# Patient Record
Sex: Female | Born: 1975
Health system: Southern US, Community
[De-identification: ages and names within clinical notes are randomized; demographics above are authoritative.]

## PROBLEM LIST (undated history)

## (undated) DIAGNOSIS — L732 Hidradenitis suppurativa: Secondary | ICD-10-CM

## (undated) HISTORY — DX: Hidradenitis suppurativa: L73.2

## (undated) HISTORY — PX: ABDOMINAL HYSTERECTOMY: SHX81

---

## 1999-12-06 ENCOUNTER — Emergency Department (HOSPITAL_COMMUNITY): Admission: EM | Admit: 1999-12-06 | Discharge: 1999-12-06 | Payer: Self-pay | Admitting: Emergency Medicine

## 2000-05-15 ENCOUNTER — Other Ambulatory Visit: Admission: RE | Admit: 2000-05-15 | Discharge: 2000-05-15 | Payer: Self-pay | Admitting: Obstetrics and Gynecology

## 2000-05-29 ENCOUNTER — Ambulatory Visit (HOSPITAL_COMMUNITY): Admission: RE | Admit: 2000-05-29 | Discharge: 2000-05-29 | Payer: Self-pay | Admitting: Obstetrics and Gynecology

## 2000-05-29 ENCOUNTER — Encounter: Payer: Self-pay | Admitting: Obstetrics and Gynecology

## 2000-09-21 ENCOUNTER — Inpatient Hospital Stay (HOSPITAL_COMMUNITY): Admission: AD | Admit: 2000-09-21 | Discharge: 2000-09-21 | Payer: Self-pay | Admitting: Obstetrics and Gynecology

## 2000-09-29 ENCOUNTER — Inpatient Hospital Stay (HOSPITAL_COMMUNITY): Admission: AD | Admit: 2000-09-29 | Discharge: 2000-10-01 | Payer: Self-pay | Admitting: Obstetrics & Gynecology

## 2003-11-17 ENCOUNTER — Other Ambulatory Visit: Admission: RE | Admit: 2003-11-17 | Discharge: 2003-11-17 | Payer: Self-pay | Admitting: Obstetrics and Gynecology

## 2003-12-21 ENCOUNTER — Emergency Department (HOSPITAL_COMMUNITY): Admission: EM | Admit: 2003-12-21 | Discharge: 2003-12-21 | Payer: Self-pay | Admitting: Emergency Medicine

## 2004-05-21 ENCOUNTER — Emergency Department (HOSPITAL_COMMUNITY): Admission: EM | Admit: 2004-05-21 | Discharge: 2004-05-21 | Payer: Self-pay | Admitting: Emergency Medicine

## 2004-07-02 ENCOUNTER — Emergency Department (HOSPITAL_COMMUNITY): Admission: EM | Admit: 2004-07-02 | Discharge: 2004-07-02 | Payer: Self-pay | Admitting: *Deleted

## 2004-07-05 ENCOUNTER — Inpatient Hospital Stay (HOSPITAL_COMMUNITY): Admission: AD | Admit: 2004-07-05 | Discharge: 2004-07-05 | Payer: Self-pay | Admitting: *Deleted

## 2004-07-06 ENCOUNTER — Inpatient Hospital Stay (HOSPITAL_COMMUNITY): Admission: AD | Admit: 2004-07-06 | Discharge: 2004-07-06 | Payer: Self-pay | Admitting: *Deleted

## 2004-07-13 ENCOUNTER — Inpatient Hospital Stay (HOSPITAL_COMMUNITY): Admission: AD | Admit: 2004-07-13 | Discharge: 2004-07-13 | Payer: Self-pay | Admitting: *Deleted

## 2004-12-16 ENCOUNTER — Other Ambulatory Visit: Admission: RE | Admit: 2004-12-16 | Discharge: 2004-12-16 | Payer: Self-pay | Admitting: Obstetrics and Gynecology

## 2005-07-21 IMAGING — CR DG ANKLE COMPLETE 3+V*R*
3 series · 3 of 3 positions shown · non-contrast
Comparison: none

CLINICAL DATA: Fall with right foot and ankle pain. Injury and swelling.
 RIGHT CDDZ-X VIEW:
 There is no evidence of fracture or dislocation.  There is no evidence of arthropathy or other focal bone abnormality.  Soft tissues are unremarkable.

[t ankle joint ap right]
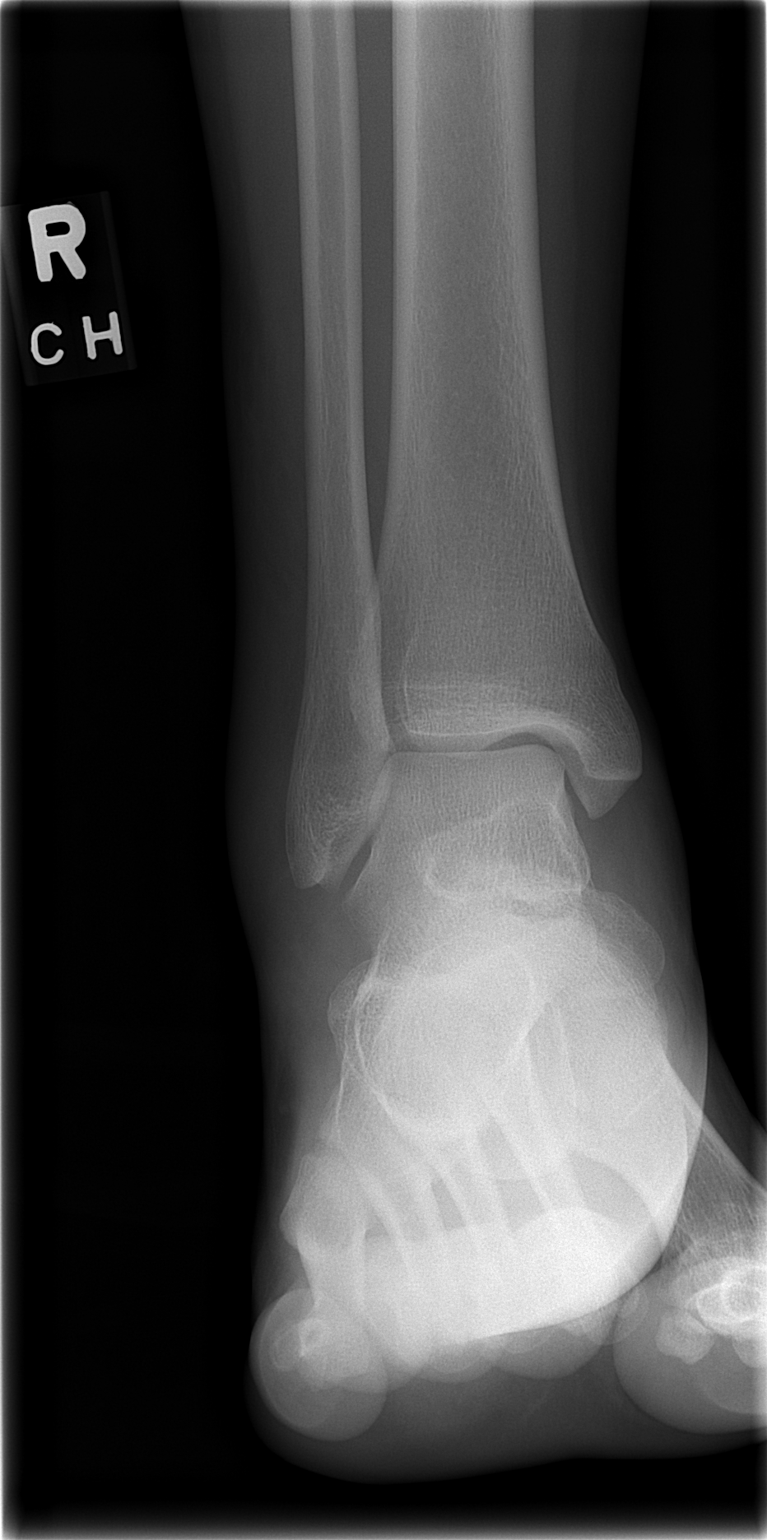

[t ankle joint oblique right]
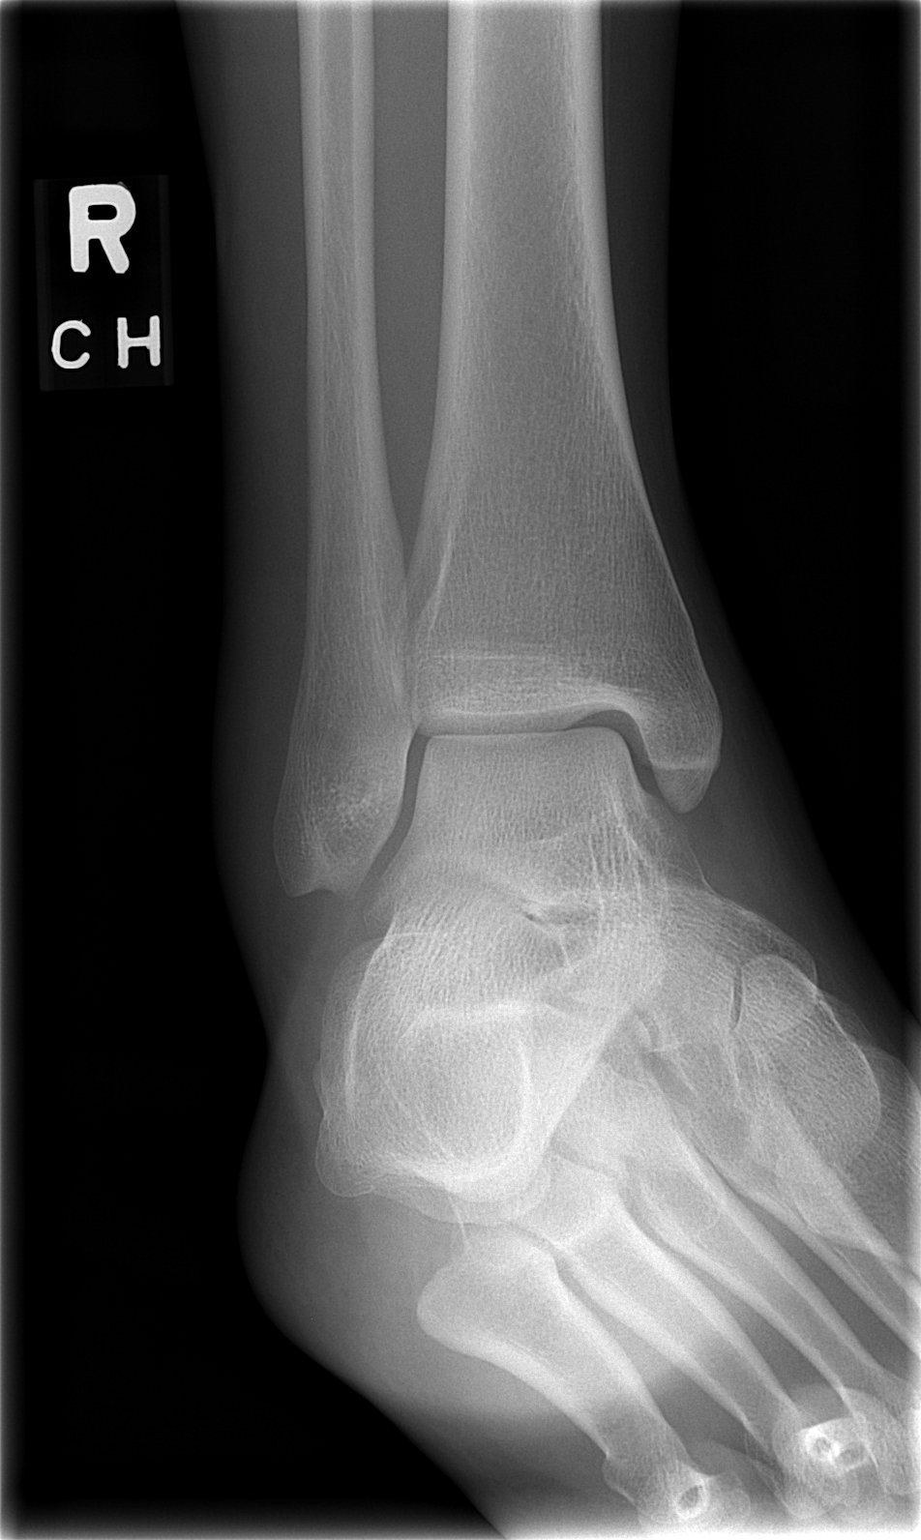

[t ankle joint lat right]
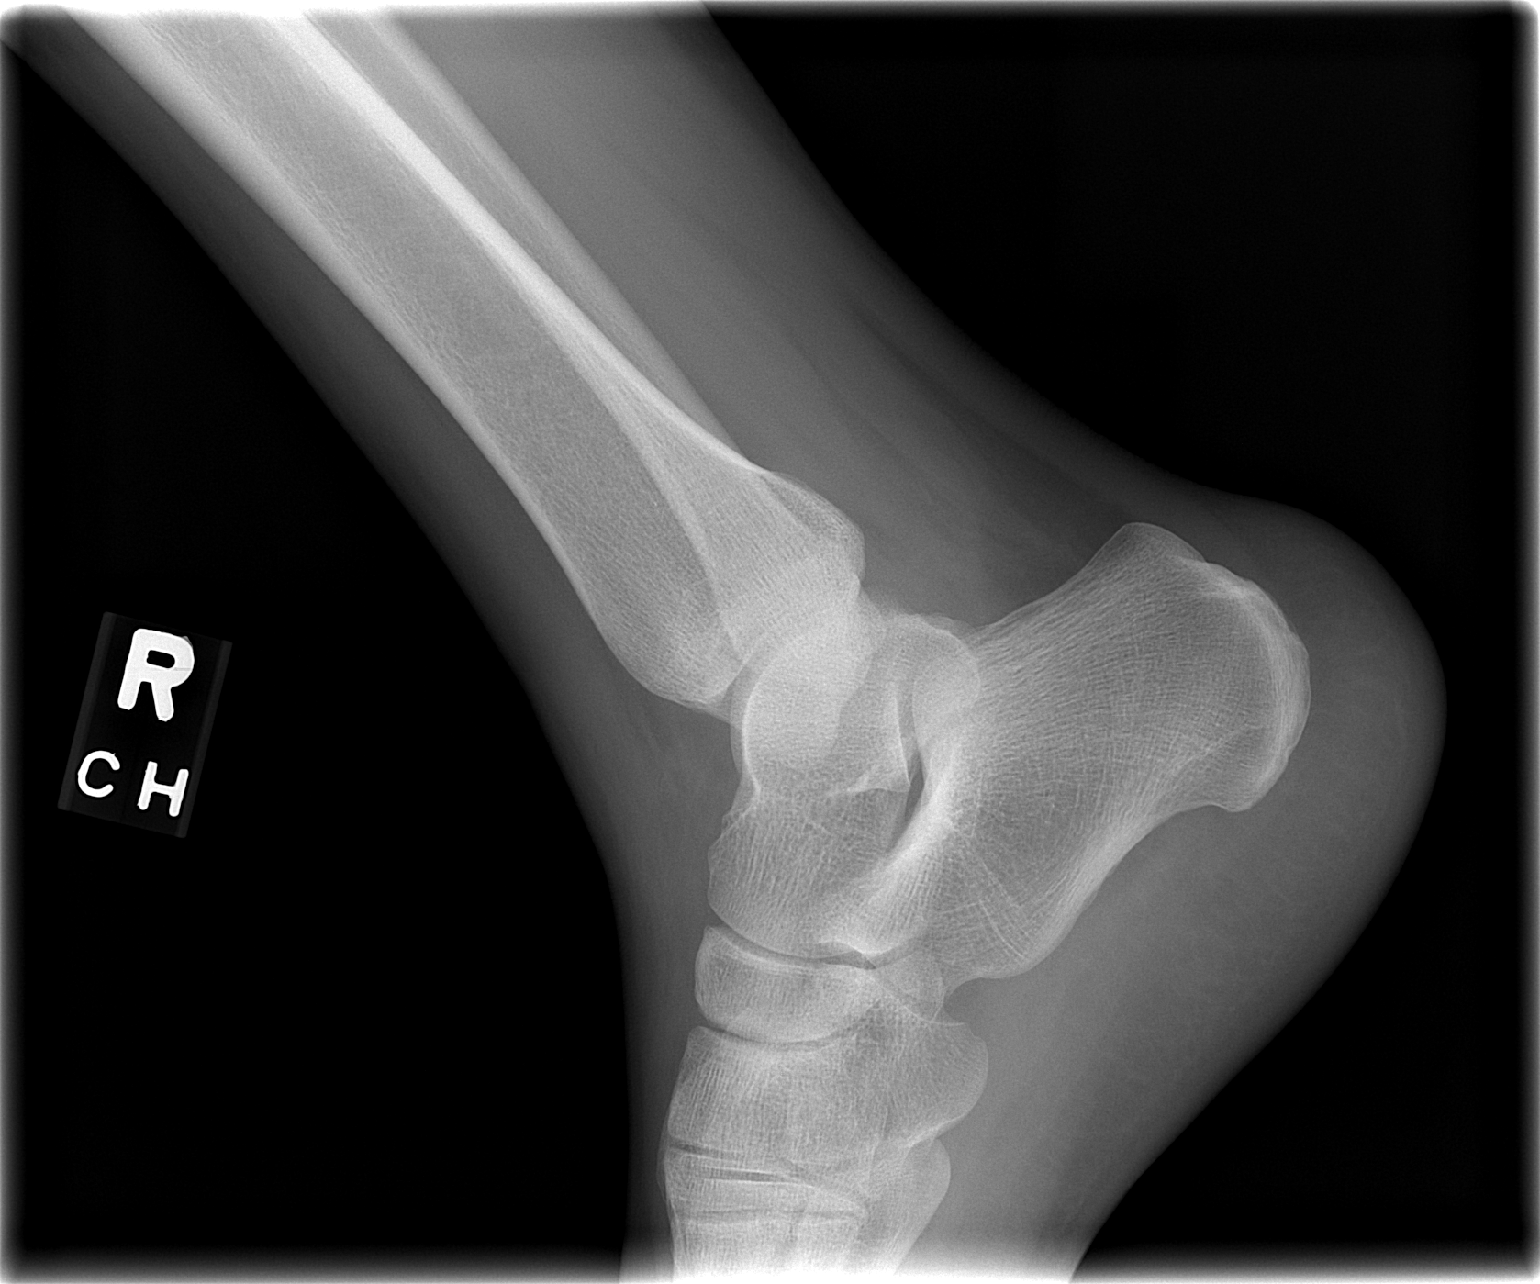

[3 of 3 positions shown; findings below may reference images not displayed]

IMPRESSION: Negative.
 RIGHT [id number] VIEW:
 Lateral soft tissue swelling without evidence of fracture, subluxation or dislocation identified.  No other significant abnormality is identified. Ankle mortise is intact.
IMPRESSION: Soft tissue swelling without evidence of fracture.

## 2005-08-18 ENCOUNTER — Ambulatory Visit (HOSPITAL_COMMUNITY): Admission: RE | Admit: 2005-08-18 | Discharge: 2005-08-19 | Payer: Self-pay | Admitting: Obstetrics and Gynecology

## 2010-02-07 ENCOUNTER — Encounter: Payer: Self-pay | Admitting: Obstetrics and Gynecology

## 2010-03-20 ENCOUNTER — Inpatient Hospital Stay (HOSPITAL_COMMUNITY)
Admission: AD | Admit: 2010-03-20 | Discharge: 2010-03-20 | Disposition: A | Discharge status: Home / Self Care | Payer: Managed Care, Other (non HMO) | Source: Ambulatory Visit | Attending: Family Medicine | Admitting: Family Medicine

## 2010-03-20 DIAGNOSIS — A499 Bacterial infection, unspecified: Secondary | ICD-10-CM

## 2010-03-20 DIAGNOSIS — N760 Acute vaginitis: Secondary | ICD-10-CM

## 2010-03-20 DIAGNOSIS — B9689 Other specified bacterial agents as the cause of diseases classified elsewhere: Secondary | ICD-10-CM | POA: Insufficient documentation

## 2010-03-20 DIAGNOSIS — N390 Urinary tract infection, site not specified: Secondary | ICD-10-CM

## 2010-03-20 LAB — URINALYSIS, ROUTINE W REFLEX MICROSCOPIC
Bilirubin Urine: NEGATIVE
Ketones, ur: NEGATIVE mg/dL
Leukocytes, UA: NEGATIVE
Protein, ur: NEGATIVE mg/dL

## 2010-03-20 LAB — WET PREP, GENITAL: Trich, Wet Prep: NONE SEEN

## 2010-03-20 LAB — URINE MICROSCOPIC-ADD ON

## 2010-05-17 ENCOUNTER — Emergency Department (HOSPITAL_COMMUNITY)
Admission: EM | Admit: 2010-05-17 | Discharge: 2010-05-17 | Disposition: A | Discharge status: Home / Self Care | Payer: No Typology Code available for payment source | Attending: Emergency Medicine | Admitting: Emergency Medicine

## 2010-05-17 ENCOUNTER — Emergency Department (HOSPITAL_COMMUNITY): Payer: No Typology Code available for payment source

## 2010-05-17 DIAGNOSIS — S93409A Sprain of unspecified ligament of unspecified ankle, initial encounter: Secondary | ICD-10-CM | POA: Insufficient documentation

## 2010-05-17 DIAGNOSIS — M25579 Pain in unspecified ankle and joints of unspecified foot: Secondary | ICD-10-CM | POA: Insufficient documentation

## 2010-05-17 DIAGNOSIS — M25476 Effusion, unspecified foot: Secondary | ICD-10-CM | POA: Insufficient documentation

## 2010-05-17 DIAGNOSIS — M25473 Effusion, unspecified ankle: Secondary | ICD-10-CM | POA: Insufficient documentation

## 2012-10-07 ENCOUNTER — Ambulatory Visit (INDEPENDENT_AMBULATORY_CARE_PROVIDER_SITE_OTHER): Payer: Managed Care, Other (non HMO) | Admitting: Family Medicine

## 2012-10-07 VITALS — BP 128/86 | HR 90 | Temp 98.7°F | Resp 18 | Ht 68.5 in | Wt 170.0 lb

## 2012-10-07 DIAGNOSIS — L02419 Cutaneous abscess of limb, unspecified: Secondary | ICD-10-CM

## 2012-10-07 DIAGNOSIS — L732 Hidradenitis suppurativa: Secondary | ICD-10-CM

## 2012-10-07 DIAGNOSIS — IMO0002 Reserved for concepts with insufficient information to code with codable children: Secondary | ICD-10-CM

## 2012-10-07 MED ORDER — TRAMADOL HCL 50 MG PO TABS: 50.0000 mg | ORAL_TABLET | Freq: Three times a day (TID) | ORAL | Status: DC | PRN

## 2012-10-07 MED ORDER — MUPIROCIN 2 % EX OINT: TOPICAL_OINTMENT | Freq: Three times a day (TID) | CUTANEOUS | Status: DC

## 2012-10-07 MED ORDER — DOXYCYCLINE HYCLATE 100 MG PO CAPS: 100.0000 mg | ORAL_CAPSULE | Freq: Two times a day (BID) | ORAL | Status: DC

## 2012-10-07 NOTE — Progress Notes (Signed)
   Patient ID: ZAYANNA PUNDT MRN: [id number], DOB: April 27, 1975, 37 y.o. Date of Encounter: 10/07/2012, 1:38 PM    PROCEDURE NOTE: Verbal consent obtained. Risks and benefits of the procedure were explained to the patient. Patient made an informed decision to proceed with the procedure. Betadine prep per usual protocol. Local anesthesia obtained with 1% lidocaine with epi 2 cc.   Lesion is linear and superficial.  2 cm incision made with 11 blade along lesion.  Culture previously taken. Small amount of purulence expressed. Lesion explored revealing no loculations. Irrigated with normal saline. Unable to pack secondary to the superficial nature of the lesion. Advised patient to perform salt water flushes.  Dressed. Wound care instructions including precautions with patient. Patient tolerated the procedure well. Recheck in 48 hours.      Signed, Eula Listen, PA-C 10/07/2012 1:38 PM

## 2012-10-07 NOTE — Patient Instructions (Signed)
Abscess °Care After °An abscess (also called a boil or furuncle) is an infected area that contains a collection of pus. Signs and symptoms of an abscess include pain, tenderness, redness, or hardness, or you may feel a moveable soft area under your skin. An abscess can occur anywhere in the body. The infection may spread to surrounding tissues causing cellulitis. A cut (incision) by the surgeon was made over your abscess and the pus was drained out. Gauze may have been packed into the space to provide a drain that will allow the cavity to heal from the inside outwards. The boil may be painful for 5 to 7 days. Most people with a boil do not have high fevers. Your abscess, if seen early, may not have localized, and may not have been lanced. If not, another appointment may be required for this if it does not get better on its own or with medications. °HOME CARE INSTRUCTIONS  °· Only take over-the-counter or prescription medicines for pain, discomfort, or fever as directed by your caregiver. °· When you bathe, soak and then remove gauze or iodoform packs at least daily or as directed by your caregiver. You may then wash the wound gently with mild soapy water. Repack with gauze or do as your caregiver directs. °SEEK IMMEDIATE MEDICAL CARE IF:  °· You develop increased pain, swelling, redness, drainage, or bleeding in the wound site. °· You develop signs of generalized infection including muscle aches, chills, fever, or a general ill feeling. °· An oral temperature above 102° F (38.9° C) develops, not controlled by medication. °See your caregiver for a recheck if you develop any of the symptoms described above. If medications (antibiotics) were prescribed, take them as directed. °Document Released: 07/22/2004 Document Revised: 03/28/2011 Document Reviewed: 03/19/2007 °ExitCare® Patient Information ©2014 [employer]. ° °Hidradenitis Suppurativa, Sweat Gland Abscess °Hidradenitis suppurativa is a long lasting (chronic),  uncommon disease of the sweat glands. With this, boil-like lumps and scarring develop in the groin, some times under the arms (axillae), and under the breasts. It may also uncommonly occur behind the ears, in the crease of the buttocks, and around the genitals.  °CAUSES  °The cause is from a blocking of the sweat glands. They then become infected. It may cause drainage and odor. It is not contagious. So it cannot be given to someone else. It most often shows up in puberty (about 10 to 38 years of age). But it may happen much later. It is similar to acne which is a disease of the sweat glands. This condition is slightly more common in African-Americans and women. °SYMPTOMS  °· Hidradenitis usually starts as one or more red, tender, swellings in the groin or under the arms (axilla). °· Over a period of hours to days the lesions get larger. They often open to the skin surface, draining clear to yellow-colored fluid. °· The infected area heals with scarring. °DIAGNOSIS  °Your caregiver makes this diagnosis by looking at you. Sometimes cultures (growing germs on plates in the lab) may be taken. This is to see what germ (bacterium) is causing the infection.  °TREATMENT  °· Topical germ killing medicine applied to the skin (antibiotics) are the treatment of choice. Antibiotics taken by mouth (systemic) are sometimes needed when the condition is getting worse or is severe. °· Avoid tight-fitting clothing which traps moisture in. °· Dirt does not cause hidradenitis and it is not caused by poor hygiene. °· Involved areas should be cleaned daily using an antibacterial soap. Some   patients find that the liquid form of Lever 2000®, applied to the involved areas as a lotion after bathing, can help reduce the odor related to this condition. °· Sometimes surgery is needed to drain infected areas or remove scarred tissue. Removal of large amounts of tissue is used only in severe cases. °· Birth control pills may be helpful. °· Oral  retinoids (vitamin A derivatives) for 6 to 12 months which are effective for acne may also help this condition. °· Weight loss will improve but not cure hidradenitis. It is made worse by being overweight. But the condition is not caused by being overweight. °· This condition is more common in people who have had acne. °· It may become worse under stress. °There is no medical cure for hidradenitis. It can be controlled, but not cured. The condition usually continues for years with periods of getting worse and getting better (remission). °Document Released: 08/18/2003 Document Revised: 03/28/2011 Document Reviewed: 09/03/2007 °ExitCare® Patient Information ©2014 [employer]. ° ° °

## 2012-10-07 NOTE — Progress Notes (Signed)
  Subjective:    Patient ID: Chloe Ortega, female    DOB: 04/28/1975, 37 y.o.   MRN: [id number] Chief Complaint  Patient presents with  . Recurrent Skin Infections    right axilla   HPI  Has a recurrent abscess in her left axilla that flairs up recurrently for many years.  Will be intermittent over the years but for the past several monthsthis one has fluctuated and has never fully resolved as keeps flairing up with sweats, using deoderant, using any product other than ivory soap.  Has been doing topical heat, warm compresses w/o resolution  History reviewed. No pertinent past medical history. No current outpatient prescriptions on file prior to visit.   No current facility-administered medications on file prior to visit.   No Known Allergies   Review of Systems  Constitutional: Negative for fever, chills, diaphoresis and activity change.  Musculoskeletal: Positive for myalgias. Negative for gait problem and joint swelling.  Skin: Positive for color change and rash. Negative for pallor and wound.  Hematological: Negative for adenopathy. Does not bruise/bleed easily.      BP 128/86  Pulse 90  Temp(Src) 98.7 F (37.1 C) (Oral)  Resp 18  Ht 5' 8.5" (1.74 m)  Wt 170 lb (77.111 kg)  BMI 25.47 kg/m2  SpO2 100% Objective:   Physical Exam  Constitutional: She is oriented to person, place, and time. She appears well-developed and well-nourished. No distress.  HENT:  Head: Normocephalic and atraumatic.  Eyes: Conjunctivae are normal. No scleral icterus.  Pulmonary/Chest: Effort normal.  Musculoskeletal: She exhibits no edema.  Neurological: She is alert and oriented to person, place, and time.  Skin: Skin is warm and dry. Lesion noted. She is not diaphoretic. There is erythema.  Left axilla w/ mult scars, sev cm nodule very tender, fluctuant, erythema, no warmth.  Psychiatric: She has a normal mood and affect. Her behavior is normal.          Assessment & Plan:   Axillary  abscess - Plan: Wound culture, Ambulatory referral to Dermatology - since recurrent will refer to derm. S/p I&D today.  Hydradenitis  Meds ordered this encounter  Medications  . doxycycline (VIBRAMYCIN) 100 MG capsule    Sig: Take 1 capsule (100 mg total) by mouth 2 (two) times daily.    Dispense:  28 capsule    Refill:  0  . mupirocin ointment (BACTROBAN) 2 %    Sig: Apply topically 3 (three) times daily.    Dispense:  30 g    Refill:  2  . traMADol (ULTRAM) 50 MG tablet    Sig: Take 1 tablet (50 mg total) by mouth every 8 (eight) hours as needed for pain.    Dispense:  20 tablet    Refill:  0    Norberto Sorenson, MD MPH

## 2012-10-09 LAB — WOUND CULTURE

## 2012-12-22 ENCOUNTER — Ambulatory Visit (INDEPENDENT_AMBULATORY_CARE_PROVIDER_SITE_OTHER): Payer: Managed Care, Other (non HMO) | Admitting: Physician Assistant

## 2012-12-22 VITALS — BP 122/74 | HR 80 | Temp 98.1°F | Resp 16 | Ht 68.0 in | Wt 169.8 lb

## 2012-12-22 DIAGNOSIS — R350 Frequency of micturition: Secondary | ICD-10-CM

## 2012-12-22 DIAGNOSIS — L732 Hidradenitis suppurativa: Secondary | ICD-10-CM

## 2012-12-22 LAB — POCT URINALYSIS DIPSTICK
Glucose, UA: NEGATIVE
Ketones, UA: NEGATIVE
Protein, UA: NEGATIVE
Spec Grav, UA: 1.025
Urobilinogen, UA: 0.2
pH, UA: 6.5

## 2012-12-22 LAB — POCT UA - MICROSCOPIC ONLY
Casts, Ur, LPF, POC: NEGATIVE
Mucus, UA: POSITIVE
Yeast, UA: NEGATIVE

## 2012-12-22 MED ORDER — TRAMADOL HCL 50 MG PO TABS: 50.0000 mg | ORAL_TABLET | Freq: Three times a day (TID) | ORAL | Status: DC | PRN

## 2012-12-22 MED ORDER — CIPROFLOXACIN HCL 500 MG PO TABS: 500.0000 mg | ORAL_TABLET | Freq: Two times a day (BID) | ORAL | Status: DC

## 2012-12-22 NOTE — Patient Instructions (Addendum)
Get plenty of rest and drink at least 64 ounces of water daily.  Apply warm compresses on the area in the underarm can help it resolve.  I will contact you with your lab results (urine culture) as soon as they are available.   If you have not heard from me in 2 weeks, please contact me.  The fastest way to get your results is to register for My Chart (see the instructions on the last page of this printout).

## 2012-12-22 NOTE — Progress Notes (Signed)
Subjective:    Patient ID: Chloe Ortega, female    DOB: February 04, 1975, 37 y.o.   MRN: [id number]  PCP: Janell Quiet, FNP (hasn't actually seen Ms. Orvan Falconer yet, is scheduled in January 2015)  Chief Complaint  Patient presents with  . Urinary Tract Infection    Frequent urination w/odor x 2-3 wks  . Medication Refill    Tramadol   Medications, allergies, past medical history, surgical history, family history, social history and problem list reviewed and updated.  HPI  Urinary frequency, urgency and burning. No fever/chills.  Mild back pain.  No abdominal pain. Not currently sexually active. No vaginal symptoms. At work is limited from using the restroom as frequently as she needs.  She uses Tramadol PRN when she develops an axillary hidradenitis lesion.  She presently has one in the RIGHT, which she is self treating at home.  She believes this one is about to drain and resolve, and doesn't want it I&D'd here today.  Review of Systems As above. No nausea/vomiting. No myalgias.    Objective:   Physical Exam  Constitutional: She is oriented to person, place, and time. Vital signs are normal. She appears well-developed and well-nourished. No distress.  HENT:  Head: Normocephalic and atraumatic.  Cardiovascular: Normal rate, regular rhythm and normal heart sounds.   Pulmonary/Chest: Effort normal and breath sounds normal.    Abdominal: Soft. Normal appearance and bowel sounds are normal. She exhibits no distension and no mass. There is no hepatosplenomegaly. There is tenderness in the suprapubic area. There is no rigidity, no rebound, no guarding, no CVA tenderness, no tenderness at McBurney's point and negative Murphy's sign. No hernia.  Musculoskeletal: Normal range of motion.       Lumbar back: Normal.  Neurological: She is alert and oriented to person, place, and time.  Skin: Skin is warm and dry. No rash noted. She is not diaphoretic. No pallor.  Psychiatric: She  has a normal mood and affect. Her speech is normal and behavior is normal. Judgment normal.    Results for orders placed in visit on 12/22/12  POCT UA - MICROSCOPIC ONLY      Result Value Range   WBC, Ur, HPF, POC tntc     RBC, urine, microscopic tntc     Bacteria, U Microscopic 2+     Mucus, UA pos     Epithelial cells, urine per micros 2-3     Crystals, Ur, HPF, POC neg     Casts, Ur, LPF, POC neg     Yeast, UA neg    POCT URINALYSIS DIPSTICK      Result Value Range   Color, UA yellow     Clarity, UA clear     Glucose, UA neg     Bilirubin, UA neg     Ketones, UA neg     Spec Grav, UA 1.025     Blood, UA mod     pH, UA 6.5     Protein, UA neg     Urobilinogen, UA 0.2     Nitrite, UA neg     Leukocytes, UA Negative           Assessment & Plan:  1. Frequent urination Treat empirically.  Await UC.  Anticipatory guidance. - POCT UA - Microscopic Only - POCT urinalysis dipstick - Urine culture - ciprofloxacin (CIPRO) 500 MG tablet; Take 1 tablet (500 mg total) by mouth 2 (two) times daily.  Dispense: 10 tablet; Refill: 0  2. Hidradenitis axillaris Continue with home treatment, but if symptoms worsen or persist, she'll return for I&D. - traMADol (ULTRAM) 50 MG tablet; Take 1 tablet (50 mg total) by mouth every 8 (eight) hours as needed.  Dispense: 20 tablet; Refill: 0   Fernande Bras, PA-C Physician Assistant-Certified [facility]

## 2012-12-24 LAB — URINE CULTURE: Colony Count: 85000

## 2012-12-28 MED ORDER — NITROFURANTOIN MONOHYD MACRO 100 MG PO CAPS: 100.0000 mg | ORAL_CAPSULE | Freq: Two times a day (BID) | ORAL | Status: DC

## 2012-12-28 NOTE — Addendum Note (Signed)
Addended by: Cydney Ok on: 12/28/2012 09:24 AM   Modules accepted: Orders

## 2013-01-04 ENCOUNTER — Encounter: Payer: Self-pay | Admitting: Family Medicine

## 2013-01-04 DIAGNOSIS — L732 Hidradenitis suppurativa: Secondary | ICD-10-CM | POA: Insufficient documentation

## 2013-01-21 ENCOUNTER — Ambulatory Visit: Payer: Managed Care, Other (non HMO) | Admitting: Family

## 2013-07-16 ENCOUNTER — Encounter (HOSPITAL_COMMUNITY): Payer: Self-pay | Admitting: Emergency Medicine

## 2013-07-16 ENCOUNTER — Emergency Department (HOSPITAL_COMMUNITY)
Admission: EM | Admit: 2013-07-16 | Discharge: 2013-07-16 | Disposition: A | Discharge status: Home / Self Care | Payer: Managed Care, Other (non HMO) | Attending: Emergency Medicine | Admitting: Emergency Medicine

## 2013-07-16 DIAGNOSIS — M549 Dorsalgia, unspecified: Secondary | ICD-10-CM | POA: Insufficient documentation

## 2013-07-16 DIAGNOSIS — R509 Fever, unspecified: Secondary | ICD-10-CM | POA: Insufficient documentation

## 2013-07-16 DIAGNOSIS — R109 Unspecified abdominal pain: Secondary | ICD-10-CM | POA: Insufficient documentation

## 2013-07-16 DIAGNOSIS — Z872 Personal history of diseases of the skin and subcutaneous tissue: Secondary | ICD-10-CM | POA: Insufficient documentation

## 2013-07-16 DIAGNOSIS — Z3202 Encounter for pregnancy test, result negative: Secondary | ICD-10-CM | POA: Insufficient documentation

## 2013-07-16 DIAGNOSIS — F17200 Nicotine dependence, unspecified, uncomplicated: Secondary | ICD-10-CM | POA: Insufficient documentation

## 2013-07-16 DIAGNOSIS — R319 Hematuria, unspecified: Secondary | ICD-10-CM

## 2013-07-16 DIAGNOSIS — Z90710 Acquired absence of both cervix and uterus: Secondary | ICD-10-CM | POA: Insufficient documentation

## 2013-07-16 DIAGNOSIS — Z792 Long term (current) use of antibiotics: Secondary | ICD-10-CM | POA: Insufficient documentation

## 2013-07-16 LAB — CBC WITH DIFFERENTIAL/PLATELET
Basophils Absolute: 0 10*3/uL (ref 0.0–0.1)
Basophils Relative: 0 % (ref 0–1)
EOS ABS: 0 10*3/uL (ref 0.0–0.7)
EOS PCT: 0 % (ref 0–5)
HEMATOCRIT: 44.4 % (ref 36.0–46.0)
HEMOGLOBIN: 14.7 g/dL (ref 12.0–15.0)
LYMPHS ABS: 0.9 10*3/uL (ref 0.7–4.0)
Lymphocytes Relative: 6 % — ABNORMAL LOW (ref 12–46)
MCH: 29.2 pg (ref 26.0–34.0)
MCHC: 33.1 g/dL (ref 30.0–36.0)
MCV: 88.1 fL (ref 78.0–100.0)
MONO ABS: 0.7 10*3/uL (ref 0.1–1.0)
Monocytes Relative: 5 % (ref 3–12)
NEUTROS ABS: 12.5 10*3/uL — AB (ref 1.7–7.7)
NEUTROS PCT: 89 % — AB (ref 43–77)
PLATELETS: 248 10*3/uL (ref 150–400)
RBC: 5.04 MIL/uL (ref 3.87–5.11)
RDW: 15 % (ref 11.5–15.5)
WBC: 14 10*3/uL — AB (ref 4.0–10.5)

## 2013-07-16 LAB — COMPREHENSIVE METABOLIC PANEL
ALBUMIN: 4.4 g/dL (ref 3.5–5.2)
ALK PHOS: 109 U/L (ref 39–117)
ALT: 26 U/L (ref 0–35)
AST: 26 U/L (ref 0–37)
BUN: 15 mg/dL (ref 6–23)
CO2: 18 mEq/L — ABNORMAL LOW (ref 19–32)
CREATININE: 0.95 mg/dL (ref 0.50–1.10)
Calcium: 10.7 mg/dL — ABNORMAL HIGH (ref 8.4–10.5)
Chloride: 102 mEq/L (ref 96–112)
GFR, EST AFRICAN AMERICAN: 86 mL/min — AB (ref >90)
GFR, EST NON AFRICAN AMERICAN: 74 mL/min — AB (ref >90)
Glucose, Bld: 140 mg/dL — ABNORMAL HIGH (ref 70–99)
Potassium: 5.2 mEq/L (ref 3.7–5.3)
SODIUM: 141 meq/L (ref 137–147)
TOTAL PROTEIN: 8.1 g/dL (ref 6.0–8.3)
Total Bilirubin: 0.5 mg/dL (ref 0.3–1.2)

## 2013-07-16 LAB — URINALYSIS, ROUTINE W REFLEX MICROSCOPIC
GLUCOSE, UA: NEGATIVE mg/dL
NITRITE: NEGATIVE
PROTEIN: 30 mg/dL — AB
Specific Gravity, Urine: 1.029 (ref 1.005–1.030)
Urobilinogen, UA: 1 mg/dL (ref 0.0–1.0)
pH: 6 (ref 5.0–8.0)

## 2013-07-16 LAB — URINE MICROSCOPIC-ADD ON

## 2013-07-16 LAB — POC URINE PREG, ED: Preg Test, Ur: NEGATIVE

## 2013-07-16 LAB — LIPASE, BLOOD: LIPASE: 18 U/L (ref 11–59)

## 2013-07-16 MED ORDER — ONDANSETRON HCL 4 MG/2ML IJ SOLN
4.0000 mg | Freq: Once | INTRAMUSCULAR | Status: AC
  Administered 2013-07-16: 4 mg via INTRAVENOUS
  Filled 2013-07-16: qty 2

## 2013-07-16 MED ORDER — SODIUM CHLORIDE 0.9 % IV BOLUS (SEPSIS)
1000.0000 mL | Freq: Once | INTRAVENOUS | Status: AC
  Administered 2013-07-16: 1000 mL via INTRAVENOUS

## 2013-07-16 MED ORDER — MORPHINE SULFATE 4 MG/ML IJ SOLN
4.0000 mg | Freq: Once | INTRAMUSCULAR | Status: AC
  Administered 2013-07-16: 4 mg via INTRAVENOUS
  Filled 2013-07-16: qty 1

## 2013-07-16 MED ORDER — ONDANSETRON HCL 4 MG PO TABS: 4.0000 mg | ORAL_TABLET | Freq: Four times a day (QID) | ORAL | Status: DC

## 2013-07-16 NOTE — ED Provider Notes (Signed)
CSN: [id number]     Arrival date & time 07/16/13  1915 History   First MD Initiated Contact with Patient 07/16/13 1942     Chief Complaint  Patient presents with  . Abdominal Pain  . Back Pain  . Emesis     (Consider location/radiation/quality/duration/timing/severity/associated sxs/prior Treatment) HPI Comments: Patient is a 38 year old female with history of hidradenitis axillaris who presents today with sudden onset nausea and vomiting. This began last night around 8PM. She ate some pasta and went back to bed. She began having right sided back and lower abdominal pain which began this morning. She reports it feels like menstrual cramps. She feels hot and cold, but has not measured her temperature. No vaginal discharge or vaginal bleeding. She reports she is unable to urinate because she is so dehydrated. She has had pain like this in the past. She has had prior abdominal hysterectomy. No past history of kidney stones.   Patient is a 38 y.o. female presenting with abdominal pain, back pain, and vomiting. The history is provided by the patient. No language interpreter was used.  Abdominal Pain Associated symptoms: chills, fever (subjective), nausea and vomiting   Associated symptoms: no chest pain, no dysuria, no shortness of breath, no vaginal bleeding and no vaginal discharge   Back Pain Associated symptoms: abdominal pain and fever (subjective)   Associated symptoms: no chest pain and no dysuria   Emesis Associated symptoms: abdominal pain and chills     Past Medical History  Diagnosis Date  . Hidradenitis axillaris    Past Surgical History  Procedure Laterality Date  . Abdominal hysterectomy     Family History  Problem Relation Age of Onset  . Hypertension Mother   . Diabetes Brother    History  Substance Use Topics  . Smoking status: Current Some Day Smoker    Types: Cigarettes  . Smokeless tobacco: Never Used  . Alcohol Use: 0.0 - 0.5 oz/week    0-1 drink(s) per  week   OB History   Grav Para Term Preterm Abortions TAB SAB Ect Mult Living                 Review of Systems  Constitutional: Positive for fever (subjective) and chills.  Respiratory: Negative for shortness of breath.   Cardiovascular: Negative for chest pain.  Gastrointestinal: Positive for nausea, vomiting and abdominal pain.  Genitourinary: Positive for flank pain and difficulty urinating. Negative for dysuria, vaginal bleeding and vaginal discharge.  Musculoskeletal: Positive for back pain.  All other systems reviewed and are negative.     Allergies  Review of patient's allergies indicates no known allergies.  Home Medications   Prior to Admission medications   Medication Sig Start Date End Date Taking? Authorizing Dannae Kato  ciprofloxacin (CIPRO) 500 MG tablet Take 1 tablet (500 mg total) by mouth 2 (two) times daily. 12/22/12   Chelle Tessa Lerner, PA-C  Doxycycline Hyclate (ACTICLATE) 150 MG TABS Take 150 mg by mouth daily.    Historical Moise Friday, MD  nitrofurantoin, macrocrystal-monohydrate, (MACROBID) 100 MG capsule Take 1 capsule (100 mg total) by mouth 2 (two) times daily. 12/28/12   Chelle S Jeffery, PA-C  traMADol (ULTRAM) 50 MG tablet Take 1 tablet (50 mg total) by mouth every 8 (eight) hours as needed. 12/22/12   Chelle S Jeffery, PA-C   BP 128/62  Pulse 64  Temp(Src) 98.5 F (36.9 C) (Oral)  Resp 18  SpO2 100% Physical Exam  Nursing note and vitals reviewed. Constitutional:  She is oriented to person, place, and time. She appears well-developed and well-nourished. No distress.  Patient appears uncomfortable   HENT:  Head: Normocephalic and atraumatic.  Right Ear: External ear normal.  Left Ear: External ear normal.  Nose: Nose normal.  Mouth/Throat: Oropharynx is clear and moist. Mucous membranes are dry.  Eyes: Conjunctivae are normal.  Neck: Normal range of motion.  No nuchal rigidity or meningeal signs  Cardiovascular: Normal rate, regular rhythm and  normal heart sounds.   Pulmonary/Chest: Effort normal and breath sounds normal. No stridor. No respiratory distress. She has no wheezes. She has no rales.  Abdominal: Soft. She exhibits no distension. There is generalized tenderness. There is no rigidity, no guarding and no CVA tenderness.  Musculoskeletal: Normal range of motion.  Neurological: She is alert and oriented to person, place, and time. She has normal strength.  Skin: Skin is warm and dry. She is not diaphoretic. No erythema.  Psychiatric: She has a normal mood and affect. Her behavior is normal.    ED Course  Procedures (including critical care time) Labs Review Labs Reviewed  CBC WITH DIFFERENTIAL - Abnormal; Notable for the following:    WBC 14.0 (*)    Neutrophils Relative % 89 (*)    Neutro Abs 12.5 (*)    Lymphocytes Relative 6 (*)    All other components within normal limits  COMPREHENSIVE METABOLIC PANEL - Abnormal; Notable for the following:    CO2 18 (*)    Glucose, Bld 140 (*)    Calcium 10.7 (*)    GFR calc non Af Amer 74 (*)    GFR calc Af Amer 86 (*)    All other components within normal limits  URINALYSIS, ROUTINE W REFLEX MICROSCOPIC - Abnormal; Notable for the following:    Color, Urine AMBER (*)    APPearance CLOUDY (*)    Hgb urine dipstick LARGE (*)    Bilirubin Urine SMALL (*)    Ketones, ur >80 (*)    Protein, ur 30 (*)    Leukocytes, UA SMALL (*)    All other components within normal limits  URINE MICROSCOPIC-ADD ON - Abnormal; Notable for the following:    Squamous Epithelial / LPF FEW (*)    Bacteria, UA FEW (*)    Crystals CA OXALATE CRYSTALS (*)    All other components within normal limits  URINE CULTURE  LIPASE, BLOOD  POC URINE PREG, ED    Imaging Review No results found.   EKG Interpretation None      MDM   Final diagnoses:  Right flank pain  Hematuria    Patient presents to ED with nausea, vomiting, and right sided flank pain. This began suddenly last night. No  prior hx of kidney stone. Patient does have mild leukocytosis which is likely from emesis. Patient with CA oxalate crystals and hematuria. Patient feels 100% improved after 1L of IVF, morphine, and zofran. I believe it is likely patient passed a stone during her ED stay. Given that she is currently asymptomatic, I do not believe that further imaging is indicated. No concern for appendicitis, ovarian torsion, ectopic pregnancy, TOA. Discussed strict return instructions with patient. Patient is appropriate for discharge and happy with care received. Discussed case with Dr. Rhunette CroftNanavati who agrees with plan. Patient / Family / Caregiver informed of clinical course, understand medical decision-making process, and agree with plan.    Medications  sodium chloride 0.9 % bolus 1,000 mL (0 mLs Intravenous Stopped 07/16/13 2312)  morphine  4 MG/ML injection 4 mg (4 mg Intravenous Given 07/16/13 2027)  ondansetron (ZOFRAN) injection 4 mg (4 mg Intravenous Given 07/16/13 2027)      Mora BellmanHannah S Merrell, PA-C 07/17/13 2136

## 2013-07-16 NOTE — Discharge Instructions (Signed)
Flank Pain °Flank pain refers to pain that is located on the side of the body between the upper abdomen and the back. The pain may occur over a short period of time (acute) or may be long-term or reoccurring (chronic). It may be mild or severe. Flank pain can be caused by many things. °CAUSES  °Some of the more common causes of flank pain include: °· Muscle strains.   °· Muscle spasms.   °· A disease of your spine (vertebral disk disease).   °· A lung infection (pneumonia).   °· Fluid around your lungs (pulmonary edema).   °· A kidney infection.   °· Kidney stones.   °· A very painful skin rash caused by the chickenpox virus (shingles).   °· Gallbladder disease.   °HOME CARE INSTRUCTIONS  °Home care will depend on the cause of your pain. In general, °· Rest as directed by your caregiver. °· Drink enough fluids to keep your urine clear or pale yellow. °· Only take over-the-counter or prescription medicines as directed by your caregiver. Some medicines may help relieve the pain. °· Tell your caregiver about any changes in your pain. °· Follow up with your caregiver as directed. °SEEK IMMEDIATE MEDICAL CARE IF:  °· Your pain is not controlled with medicine.   °· You have new or worsening symptoms. °· Your pain increases.   °· You have abdominal pain.   °· You have shortness of breath.   °· You have persistent nausea or vomiting.   °· You have swelling in your abdomen.   °· You feel faint or pass out.   °· You have blood in your urine. °· You have a fever or persistent symptoms for more than 2-3 days. °· You have a fever and your symptoms suddenly get worse. °MAKE SURE YOU:  °· Understand these instructions. °· Will watch your condition. °· Will get help right away if you are not doing well or get worse. °Document Released: 02/24/2005 Document Revised: 09/28/2011 Document Reviewed: 08/18/2011 °ExitCare® Patient Information ©2015 [employer]. This information is not intended to replace advice given to you by your  health care provider. Make sure you discuss any questions you have with your health care provider. ° °

## 2013-07-16 NOTE — ED Notes (Signed)
Pt reports developing generalized abdominal pain and lower right back pain. Pt also reports nausea, emesis, and the inability to keep down any food or fluids. Pt denies diarrhea. Pt states that symptom onset was at 0230 this morning, which awoke her from sleeping. Pt denies vaginal discharge or bleeding. Pt is A/O x4, vitals are WDL, and pt is in NAD.

## 2013-07-18 LAB — URINE CULTURE
Colony Count: NO GROWTH
Culture: NO GROWTH

## 2013-07-18 NOTE — ED Provider Notes (Signed)
Medical screening examination/treatment/procedure(s) were performed by non-physician practitioner and as supervising physician I was immediately available for consultation/collaboration.   EKG Interpretation None       Derwood KaplanAnkit Nanavati, MD 07/18/13 [phone]

## 2013-09-14 ENCOUNTER — Emergency Department (HOSPITAL_COMMUNITY)
Admission: EM | Admit: 2013-09-14 | Discharge: 2013-09-14 | Disposition: A | Discharge status: Home / Self Care | Payer: Managed Care, Other (non HMO) | Attending: Emergency Medicine | Admitting: Emergency Medicine

## 2013-09-14 ENCOUNTER — Encounter (HOSPITAL_COMMUNITY): Payer: Self-pay | Admitting: Emergency Medicine

## 2013-09-14 DIAGNOSIS — F17200 Nicotine dependence, unspecified, uncomplicated: Secondary | ICD-10-CM | POA: Insufficient documentation

## 2013-09-14 DIAGNOSIS — IMO0002 Reserved for concepts with insufficient information to code with codable children: Secondary | ICD-10-CM | POA: Insufficient documentation

## 2013-09-14 DIAGNOSIS — L02412 Cutaneous abscess of left axilla: Secondary | ICD-10-CM

## 2013-09-14 DIAGNOSIS — F1210 Cannabis abuse, uncomplicated: Secondary | ICD-10-CM | POA: Insufficient documentation

## 2013-09-14 DIAGNOSIS — Z79899 Other long term (current) drug therapy: Secondary | ICD-10-CM | POA: Insufficient documentation

## 2013-09-14 MED ORDER — DOXYCYCLINE HYCLATE 100 MG PO CAPS: 100.0000 mg | ORAL_CAPSULE | Freq: Two times a day (BID) | ORAL | Status: DC

## 2013-09-14 MED ORDER — HYDROCODONE-ACETAMINOPHEN 5-325 MG PO TABS: 1.0000 | ORAL_TABLET | ORAL | Status: DC | PRN

## 2013-09-14 MED ORDER — LIDOCAINE-EPINEPHRINE 2 %-1:100000 IJ SOLN
INTRAMUSCULAR | Status: AC
  Administered 2013-09-14: 1 mL
  Filled 2013-09-14: qty 1

## 2013-09-14 NOTE — ED Provider Notes (Signed)
Medical screening examination/treatment/procedure(s) were performed by non-physician practitioner and as supervising physician I was immediately available for consultation/collaboration.   EKG Interpretation None       Doug Sou, MD 09/14/13 1055

## 2013-09-14 NOTE — Discharge Instructions (Signed)
Abscess °An abscess is an infected area that contains a collection of pus and debris. It can occur in almost any part of the body. An abscess is also known as a furuncle or boil. °CAUSES  °An abscess occurs when tissue gets infected. This can occur from blockage of oil or sweat glands, infection of hair follicles, or a minor injury to the skin. As the body tries to fight the infection, pus collects in the area and creates pressure under the skin. This pressure causes pain. People with weakened immune systems have difficulty fighting infections and get certain abscesses more often.  °SYMPTOMS °Usually an abscess develops on the skin and becomes a painful mass that is red, warm, and tender. If the abscess forms under the skin, you may feel a moveable soft area under the skin. Some abscesses break open (rupture) on their own, but most will continue to get worse without care. The infection can spread deeper into the body and eventually into the bloodstream, causing you to feel ill.  °DIAGNOSIS  °Your caregiver will take your medical history and perform a physical exam. A sample of fluid may also be taken from the abscess to determine what is causing your infection. °TREATMENT  °Your caregiver may prescribe antibiotic medicines to fight the infection. However, taking antibiotics alone usually does not cure an abscess. Your caregiver may need to make a small cut (incision) in the abscess to drain the pus. In some cases, gauze is packed into the abscess to reduce pain and to continue draining the area. °HOME CARE INSTRUCTIONS  °· Only take over-the-counter or prescription medicines for pain, discomfort, or fever as directed by your caregiver. °· If you were prescribed antibiotics, take them as directed. Finish them even if you start to feel better. °· If gauze is used, follow your caregiver's directions for changing the gauze. °· To avoid spreading the infection: °· Keep your draining abscess covered with a  bandage. °· Wash your hands well. °· Do not share personal care items, towels, or whirlpools with others. °· Avoid skin contact with others. °· Keep your skin and clothes clean around the abscess. °· Keep all follow-up appointments as directed by your caregiver. °SEEK MEDICAL CARE IF:  °· You have increased pain, swelling, redness, fluid drainage, or bleeding. °· You have muscle aches, chills, or a general ill feeling. °· You have a fever. °MAKE SURE YOU:  °· Understand these instructions. °· Will watch your condition. °· Will get help right away if you are not doing well or get worse. °Document Released: 10/13/2004 Document Revised: 07/05/2011 Document Reviewed: 03/18/2011 °[employer]® Patient Information ©2015 [employer]. This information is not intended to replace advice given to you by your health care provider. Make sure you discuss any questions you have with your health care provider. ° °Abscess °Care After °An abscess (also called a boil or furuncle) is an infected area that contains a collection of pus. Signs and symptoms of an abscess include pain, tenderness, redness, or hardness, or you may feel a moveable soft area under your skin. An abscess can occur anywhere in the body. The infection may spread to surrounding tissues causing cellulitis. A cut (incision) by the surgeon was made over your abscess and the pus was drained out. Gauze may have been packed into the space to provide a drain that will allow the cavity to heal from the inside outwards. The boil may be painful for 5 to 7 days. Most people with a boil do not have   high fevers. Your abscess, if seen early, may not have localized, and may not have been lanced. If not, another appointment may be required for this if it does not get better on its own or with medications. °HOME CARE INSTRUCTIONS  °· Only take over-the-counter or prescription medicines for pain, discomfort, or fever as directed by your caregiver. °· When you bathe, soak and then  remove gauze or iodoform packs at least daily or as directed by your caregiver. You may then wash the wound gently with mild soapy water. Repack with gauze or do as your caregiver directs. °SEEK IMMEDIATE MEDICAL CARE IF:  °· You develop increased pain, swelling, redness, drainage, or bleeding in the wound site. °· You develop signs of generalized infection including muscle aches, chills, fever, or a general ill feeling. °· An oral temperature above 102° F (38.9° C) develops, not controlled by medication. °See your caregiver for a recheck if you develop any of the symptoms described above. If medications (antibiotics) were prescribed, take them as directed. °Document Released: 07/22/2004 Document Revised: 03/28/2011 Document Reviewed: 03/19/2007 °[employer]® Patient Information ©2015 [employer]. This information is not intended to replace advice given to you by your health care provider. Make sure you discuss any questions you have with your health care provider. ° °

## 2013-09-14 NOTE — ED Provider Notes (Signed)
CSN: [id number]     Arrival date & time 09/14/13  0941 History   First MD Initiated Contact with Patient 09/14/13 1001     Chief Complaint  Patient presents with  . Abscess     (Consider location/radiation/quality/duration/timing/severity/associated sxs/prior Treatment) Patient is a 38 y.o. female presenting with abscess. The history is provided by the patient. No language interpreter was used.  Abscess Location:  Shoulder/arm Shoulder/arm abscess location:  L axilla Abscess quality: induration, painful and redness   Abscess quality: not draining and not weeping   Associated symptoms: no fever and no nausea   Associated symptoms comment:  Patient with HS and recurrent abscess in left axilla for the past 3 days. She tried to open it to drain at home which caused worsening symptoms. No fever.    Past Medical History  Diagnosis Date  . Hidradenitis axillaris    Past Surgical History  Procedure Laterality Date  . Abdominal hysterectomy     Family History  Problem Relation Age of Onset  . Hypertension Mother   . Diabetes Brother    History  Substance Use Topics  . Smoking status: Current Some Day Smoker    Types: Cigarettes  . Smokeless tobacco: Never Used  . Alcohol Use: 0.0 - 0.5 oz/week    0-1 drink(s) per week   OB History   Grav Para Term Preterm Abortions TAB SAB Ect Mult Living                 Review of Systems  Constitutional: Negative for fever and chills.  Cardiovascular: Negative.  Negative for chest pain.  Gastrointestinal: Negative.  Negative for nausea.  Musculoskeletal: Negative.   Skin:       C/O Abscess.  Neurological: Negative.       Allergies  Acetaminophen  Home Medications   Prior to Admission medications   Medication Sig Start Date End Date Taking? Authorizing Provider  doxepin (SINEQUAN) 150 MG capsule Take 150 mg by mouth at bedtime.    Historical Provider, MD  ondansetron (ZOFRAN) 4 MG tablet Take 1 tablet (4 mg total) by mouth  every 6 (six) hours. 07/16/13   Ramon Dredge Merrell, PA-C   BP 138/69  Pulse 113  Temp(Src) 99.9 F (37.7 C) (Oral)  Resp 20  SpO2 100% Physical Exam  Constitutional: She is oriented to person, place, and time. She appears well-developed and well-nourished.  Neck: Normal range of motion.  Pulmonary/Chest: Effort normal.  Neurological: She is alert and oriented to person, place, and time.  Skin: Skin is warm and dry.  Large left axillary abscess with marked enlargement, induration and minimal redness. No drainage.   Psychiatric: She has a normal mood and affect.    ED Course  Procedures (including critical care time) Labs Review Labs Reviewed - No data to display  Imaging Review No results found.   EKG Interpretation None     INCISION AND DRAINAGE Performed by: Elpidio Anis A Consent: Verbal consent obtained. Risks and benefits: risks, benefits and alternatives were discussed Type: abscess  Body area: left axilla  Anesthesia: local infiltration  Incision was made with a scalpel.  Local anesthetic: lidocaine 2% w/epinephrine  Anesthetic total: 2 ml  Complexity: complex Blunt dissection to break up loculations  Drainage: purulent  Drainage amount: large  Packing material: 1/4 in iodoform gauze  Patient tolerance: Patient tolerated the procedure well with no immediate complications.    MDM   Final diagnoses:  None    1. Left axillary  abscess 2. History of hidradenitis  Uncomplicated cutaneous abscess left axilla with adequate drainage. Stable for discharge.   Arnoldo Hooker, PA-C 09/14/13 1038

## 2013-09-14 NOTE — ED Notes (Signed)
Pt from home c/o abscess in left axilla x3 days. Pt has hx of the same. Pt denies N/V, fevers. Pt reports that she is taking abx daily for the same. Pt is A&O and in NAD

## 2013-12-29 ENCOUNTER — Emergency Department (HOSPITAL_COMMUNITY)
Admission: EM | Admit: 2013-12-29 | Discharge: 2013-12-29 | Disposition: A | Discharge status: Home / Self Care | Payer: BC Managed Care – PPO | Attending: Emergency Medicine | Admitting: Emergency Medicine

## 2013-12-29 ENCOUNTER — Encounter (HOSPITAL_COMMUNITY): Payer: Self-pay

## 2013-12-29 DIAGNOSIS — Z79899 Other long term (current) drug therapy: Secondary | ICD-10-CM | POA: Diagnosis not present

## 2013-12-29 DIAGNOSIS — Z720 Tobacco use: Secondary | ICD-10-CM | POA: Diagnosis not present

## 2013-12-29 DIAGNOSIS — L0291 Cutaneous abscess, unspecified: Secondary | ICD-10-CM

## 2013-12-29 DIAGNOSIS — L02411 Cutaneous abscess of right axilla: Secondary | ICD-10-CM | POA: Diagnosis present

## 2013-12-29 MED ORDER — HYDROCODONE-ACETAMINOPHEN 5-325 MG PO TABS: 1.0000 | ORAL_TABLET | Freq: Four times a day (QID) | ORAL | Status: DC | PRN

## 2013-12-29 MED ORDER — LIDOCAINE HCL 2 % IJ SOLN
10.0000 mL | Freq: Once | INTRAMUSCULAR | Status: AC
  Administered 2013-12-29: 200 mg
  Filled 2013-12-29: qty 20

## 2013-12-29 MED ORDER — IBUPROFEN 800 MG PO TABS
800.0000 mg | ORAL_TABLET | Freq: Once | ORAL | Status: AC
  Administered 2013-12-29: 800 mg via ORAL
  Filled 2013-12-29: qty 1

## 2013-12-29 MED ORDER — CLINDAMYCIN HCL 300 MG PO CAPS: 300.0000 mg | ORAL_CAPSULE | Freq: Three times a day (TID) | ORAL | Status: DC

## 2013-12-29 NOTE — ED Provider Notes (Signed)
CSN: [id number]     Arrival date & time 12/29/13  [id number] History   First MD Initiated Contact with Patient 12/29/13 1007     Chief Complaint  Patient presents with  . Abscess     (Consider location/radiation/quality/duration/timing/severity/associated sxs/prior Treatment) HPI Comments: Pt comes in with complaint of abscess to the right axilla that started 4 days ago. She states that she does have a history of similar. She takes doxy on on continuous basis. No fever.  The history is provided by the patient. No language interpreter was used.    Past Medical History  Diagnosis Date  . Hidradenitis axillaris    Past Surgical History  Procedure Laterality Date  . Abdominal hysterectomy     Family History  Problem Relation Age of Onset  . Hypertension Mother   . Diabetes Brother    History  Substance Use Topics  . Smoking status: Current Some Day Smoker    Types: Cigarettes  . Smokeless tobacco: Never Used  . Alcohol Use: 0.0 - 0.5 oz/week    0-1 drink(s) per week   OB History    No data available     Review of Systems  All other systems reviewed and are negative.     Allergies  Acetaminophen  Home Medications   Prior to Admission medications   Medication Sig Start Date End Date Taking? Authorizing Provider  doxepin (SINEQUAN) 150 MG capsule Take 150 mg by mouth at bedtime.    Historical Provider, MD  doxycycline (VIBRAMYCIN) 100 MG capsule Take 1 capsule (100 mg total) by mouth 2 (two) times daily. At the end of 7 days, go back to your maintenance dose of Doxycycline of 150 mg daily 09/14/13   Shari A Upstill, PA-C  HYDROcodone-acetaminophen (NORCO/VICODIN) 5-325 MG per tablet Take 1-2 tablets by mouth every 4 (four) hours as needed. 09/14/13   Shari A Upstill, PA-C  ondansetron (ZOFRAN) 4 MG tablet Take 1 tablet (4 mg total) by mouth every 6 (six) hours. 07/16/13   Ramon DredgeHannah S Merrell, PA-C   BP 146/63 mmHg  Pulse 106  Temp(Src) 97.3 F (36.3 C) (Oral)  Resp 16   SpO2 99% Physical Exam  Constitutional: She is oriented to person, place, and time. She appears well-developed and well-nourished.  Cardiovascular: Normal rate and regular rhythm.   Pulmonary/Chest: Effort normal and breath sounds normal.  Musculoskeletal: Normal range of motion.  Neurological: She is alert and oriented to person, place, and time.  Skin:  Fluctuant area to right axilla  Nursing note and vitals reviewed.   ED Course  INCISION AND DRAINAGE Date/Time: 12/29/2013 11:06 AM Performed by: Teressa LowerPICKERING, Danzel Marszalek Authorized by: Teressa LowerPICKERING, Carolin Quang Consent: Verbal consent obtained. Consent given by: patient Patient identity confirmed: verbally with patient Type: abscess Body area: upper extremity Anesthesia: local infiltration Local anesthetic: lidocaine 2% without epinephrine Scalpel size: 11 Incision type: single straight Complexity: simple Drainage: purulent Drainage amount: copious Wound treatment: wound left open Packing material: 1/4 in iodoform gauze Patient tolerance: Patient tolerated the procedure well with no immediate complications   (including critical care time) Labs Review Labs Reviewed - No data to display  Imaging Review No results found.   EKG Interpretation None      MDM   Final diagnoses:  Abscess    Will switch from doxy to clinda. Pt given return precautions. I&D without complication    Teressa LowerVrinda Raequon Catanzaro, NP 12/29/13 1107  Juliet RudeNathan R. Rubin PayorPickering, MD 12/29/13 [phone]

## 2013-12-29 NOTE — ED Notes (Signed)
She c/o abscess at right axilla area (has hx of hydranitis).  She is in no distress.

## 2013-12-29 NOTE — Discharge Instructions (Signed)

## 2014-10-23 ENCOUNTER — Encounter (HOSPITAL_COMMUNITY): Payer: Self-pay | Admitting: Emergency Medicine

## 2014-10-23 ENCOUNTER — Emergency Department (HOSPITAL_COMMUNITY)
Admission: EM | Admit: 2014-10-23 | Discharge: 2014-10-23 | Disposition: A | Discharge status: Home / Self Care | Payer: BLUE CROSS/BLUE SHIELD | Attending: Emergency Medicine | Admitting: Emergency Medicine

## 2014-10-23 ENCOUNTER — Emergency Department (HOSPITAL_COMMUNITY): Payer: BLUE CROSS/BLUE SHIELD

## 2014-10-23 DIAGNOSIS — R52 Pain, unspecified: Secondary | ICD-10-CM | POA: Insufficient documentation

## 2014-10-23 DIAGNOSIS — R1110 Vomiting, unspecified: Secondary | ICD-10-CM | POA: Diagnosis not present

## 2014-10-23 DIAGNOSIS — R34 Anuria and oliguria: Secondary | ICD-10-CM | POA: Insufficient documentation

## 2014-10-23 DIAGNOSIS — Z720 Tobacco use: Secondary | ICD-10-CM | POA: Diagnosis not present

## 2014-10-23 DIAGNOSIS — Z872 Personal history of diseases of the skin and subcutaneous tissue: Secondary | ICD-10-CM | POA: Insufficient documentation

## 2014-10-23 DIAGNOSIS — R05 Cough: Secondary | ICD-10-CM | POA: Diagnosis not present

## 2014-10-23 DIAGNOSIS — Z792 Long term (current) use of antibiotics: Secondary | ICD-10-CM | POA: Insufficient documentation

## 2014-10-23 DIAGNOSIS — J029 Acute pharyngitis, unspecified: Secondary | ICD-10-CM | POA: Insufficient documentation

## 2014-10-23 DIAGNOSIS — R509 Fever, unspecified: Secondary | ICD-10-CM | POA: Diagnosis not present

## 2014-10-23 DIAGNOSIS — R197 Diarrhea, unspecified: Secondary | ICD-10-CM | POA: Insufficient documentation

## 2014-10-23 DIAGNOSIS — R6889 Other general symptoms and signs: Secondary | ICD-10-CM

## 2014-10-23 LAB — URINE MICROSCOPIC-ADD ON

## 2014-10-23 LAB — CBC WITH DIFFERENTIAL/PLATELET
BASOS ABS: 0 10*3/uL (ref 0.0–0.1)
Basophils Relative: 0 %
Eosinophils Absolute: 0 10*3/uL (ref 0.0–0.7)
Eosinophils Relative: 1 %
HCT: 43.2 % (ref 36.0–46.0)
Hemoglobin: 14.1 g/dL (ref 12.0–15.0)
LYMPHS ABS: 1.4 10*3/uL (ref 0.7–4.0)
LYMPHS PCT: 37 %
MCH: 29.1 pg (ref 26.0–34.0)
MCHC: 32.6 g/dL (ref 30.0–36.0)
MCV: 89.3 fL (ref 78.0–100.0)
Monocytes Absolute: 0.6 10*3/uL (ref 0.1–1.0)
Monocytes Relative: 16 %
NEUTROS PCT: 46 %
Neutro Abs: 1.8 10*3/uL (ref 1.7–7.7)
Platelets: 210 10*3/uL (ref 150–400)
RBC: 4.84 MIL/uL (ref 3.87–5.11)
RDW: 14.8 % (ref 11.5–15.5)
WBC: 3.8 10*3/uL — AB (ref 4.0–10.5)

## 2014-10-23 LAB — URINALYSIS, ROUTINE W REFLEX MICROSCOPIC
GLUCOSE, UA: NEGATIVE mg/dL
Ketones, ur: NEGATIVE mg/dL
NITRITE: NEGATIVE
PH: 6 (ref 5.0–8.0)
Protein, ur: NEGATIVE mg/dL
Specific Gravity, Urine: 1.034 — ABNORMAL HIGH (ref 1.005–1.030)
Urobilinogen, UA: 2 mg/dL — ABNORMAL HIGH (ref 0.0–1.0)

## 2014-10-23 LAB — COMPREHENSIVE METABOLIC PANEL
ALT: 21 U/L (ref 14–54)
AST: 24 U/L (ref 15–41)
Albumin: 3.9 g/dL (ref 3.5–5.0)
Alkaline Phosphatase: 86 U/L (ref 38–126)
Anion gap: 8 (ref 5–15)
BUN: 8 mg/dL (ref 6–20)
CO2: 21 mmol/L — ABNORMAL LOW (ref 22–32)
Calcium: 9.2 mg/dL (ref 8.9–10.3)
Chloride: 107 mmol/L (ref 101–111)
Creatinine, Ser: 0.69 mg/dL (ref 0.44–1.00)
Glucose, Bld: 96 mg/dL (ref 65–99)
POTASSIUM: 3.9 mmol/L (ref 3.5–5.1)
Sodium: 136 mmol/L (ref 135–145)
Total Bilirubin: 0.3 mg/dL (ref 0.3–1.2)
Total Protein: 7.4 g/dL (ref 6.5–8.1)

## 2014-10-23 MED ORDER — ACETAMINOPHEN 500 MG PO TABS
1000.0000 mg | ORAL_TABLET | Freq: Once | ORAL | Status: AC
  Administered 2014-10-23: 1000 mg via ORAL
  Filled 2014-10-23: qty 2

## 2014-10-23 MED ORDER — SODIUM CHLORIDE 0.9 % IV BOLUS (SEPSIS)
1000.0000 mL | Freq: Once | INTRAVENOUS | Status: AC
  Administered 2014-10-23: 1000 mL via INTRAVENOUS

## 2014-10-23 NOTE — ED Notes (Signed)
Off floor for testing 

## 2014-10-23 NOTE — ED Notes (Signed)
Pt states that 2 nights ago she started having body aches, fever, vomiting and diarrhea once. Pt states that she took tylenol for the fever but still having body aches, chills, and thinks she is breaking out in rash due to her sweating bc she is starting to itch. Pt states that she hasnt had any vomiting since Wed morning. But is c/o of headache

## 2014-10-23 NOTE — ED Provider Notes (Signed)
CSN: [id number]     Arrival date & time 10/23/14  0721 History   First MD Initiated Contact with Patient 10/23/14 [phone]     Chief Complaint  Patient presents with  . Generalized Body Aches  . Eye Pain  . Pruritis     (Consider location/radiation/quality/duration/timing/severity/associated sxs/prior Treatment) HPI  39 year old female presents with 2 days of feeling ill. Started with episode of vomiting and back pain 2 days ago, had a fever of 101. 2 episodes of diarrhea, none since. Vomited once yesterday, otherwise no more vomiting. Has a headache and pain around her eyes as well. Has a sore/dry throat and dry cough. Diffuse body aches and chills. Taking tylenol with some relief. Denies neck stiffness. Has decreased po intake. Decreased urine output but no dysuria. No weakness/numbness. Rode home on a plan 4 days ago and states many people were coughing and seemed sick. Did not get a flu shot this year.  Past Medical History  Diagnosis Date  . Hidradenitis axillaris    Past Surgical History  Procedure Laterality Date  . Abdominal hysterectomy     Family History  Problem Relation Age of Onset  . Hypertension Mother   . Diabetes Brother    Social History  Substance Use Topics  . Smoking status: Current Some Day Smoker    Types: Cigarettes  . Smokeless tobacco: Never Used  . Alcohol Use: 0.0 - 0.5 oz/week    0-1 drink(s) per week   OB History    No data available     Review of Systems  Constitutional: Positive for fever and chills.  HENT: Positive for sore throat. Negative for ear pain.   Eyes: Negative for visual disturbance.  Respiratory: Positive for cough. Negative for shortness of breath.   Cardiovascular: Negative for chest pain.  Gastrointestinal: Positive for vomiting and diarrhea. Negative for abdominal pain.  Genitourinary: Positive for decreased urine volume. Negative for dysuria.  Musculoskeletal: Positive for myalgias and back pain. Negative for neck pain and  neck stiffness.  Neurological: Positive for headaches.  All other systems reviewed and are negative.     Allergies  Vicodin  Home Medications   Prior to Admission medications   Medication Sig Start Date End Date Taking? Authorizing Provider  clindamycin (CLEOCIN) 300 MG capsule Take 1 capsule (300 mg total) by mouth 3 (three) times daily. 12/29/13   Teressa Lower, NP  doxycycline (ADOXA) 150 MG tablet Take 50 mg by mouth 3 (three) times daily.    Historical Provider, MD  HYDROcodone-acetaminophen (NORCO/VICODIN) 5-325 MG per tablet Take 1-2 tablets by mouth every 6 (six) hours as needed. 12/29/13   Teressa Lower, NP  traMADol (ULTRAM) 50 MG tablet Take 50 mg by mouth every 6 (six) hours as needed for moderate pain.    Historical Provider, MD   BP 121/97 mmHg  Pulse 74  Temp(Src) 98.7 F (37.1 C) (Oral)  Resp 18  SpO2 100% Physical Exam  Constitutional: She is oriented to person, place, and time. She appears well-developed and well-nourished.  HENT:  Head: Normocephalic and atraumatic.  Right Ear: External ear normal.  Left Ear: External ear normal.  Nose: Nose normal.  Mouth/Throat: No oropharyngeal exudate.  Eyes: EOM are normal. Pupils are equal, round, and reactive to light. Right eye exhibits no discharge. Left eye exhibits no discharge.  Neck: Normal range of motion. Neck supple.  No meningismus  Cardiovascular: Normal rate, regular rhythm and normal heart sounds.   Pulmonary/Chest: Effort normal and breath sounds  normal. She has no wheezes.  Abdominal: Soft. She exhibits no distension. There is no tenderness.  Musculoskeletal:  No lower back or CVA tenderness. No focal joint swelling or tenderness  Neurological: She is alert and oriented to person, place, and time.  Skin: Skin is warm and dry. She is not diaphoretic.  Nursing note and vitals reviewed.   ED Course  Procedures (including critical care time) Labs Review Labs Reviewed  COMPREHENSIVE METABOLIC  PANEL - Abnormal; Notable for the following:    CO2 21 (*)    All other components within normal limits  CBC WITH DIFFERENTIAL/PLATELET - Abnormal; Notable for the following:    WBC 3.8 (*)    All other components within normal limits  URINALYSIS, ROUTINE W REFLEX MICROSCOPIC (NOT AT [facility]) - Abnormal; Notable for the following:    Color, Urine AMBER (*)    APPearance CLOUDY (*)    Specific Gravity, Urine 1.034 (*)    Hgb urine dipstick SMALL (*)    Bilirubin Urine SMALL (*)    Urobilinogen, UA 2.0 (*)    Leukocytes, UA SMALL (*)    All other components within normal limits  URINE MICROSCOPIC-ADD ON - Abnormal; Notable for the following:    Crystals CA OXALATE CRYSTALS (*)    All other components within normal limits  URINE CULTURE    Imaging Review Dg Chest 2 View  10/23/2014   CLINICAL DATA:  39 year old female with 2 nights of fever, vomiting, cough, headache and body aches. Initial encounter.  EXAM: CHEST  2 VIEW  COMPARISON:  None.  FINDINGS: Normal lung volumes. Normal cardiac size and mediastinal contours. Visualized tracheal air column is within normal limits. No pneumothorax, pulmonary edema, pleural effusion or confluent pulmonary opacity. Suggestion of mild right upper lobe scarring near the lateral right second rib. No acute osseous abnormality identified.  IMPRESSION: No acute cardiopulmonary abnormality.   Electronically Signed   By: Odessa Fleming M.D.   On: 10/23/2014 08:02   I have personally reviewed and evaluated these images and lab results as part of my medical decision-making.   EKG Interpretation None      MDM   Final diagnoses:  Flu-like symptoms    Patient feels much better after Tylenol and IV fluids. Her symptoms are most consistent with a viral illness, likely influenza. Symptoms started 3 days ago and thus is out of the window for Tamiflu and does not have any high risk factors that would indicate antivirals treatment for flu. Does have small blood but no  signs of a UTI no symptoms of UTI. Plan to treat with Tylenol, ibuprofen, and fluids and symptomatically air and follow-up with her PCP.    Pricilla Loveless, MD 10/23/14 [phone]

## 2014-10-24 LAB — URINE CULTURE

## 2015-07-10 ENCOUNTER — Encounter (HOSPITAL_COMMUNITY): Payer: Self-pay | Admitting: Emergency Medicine

## 2015-07-10 ENCOUNTER — Emergency Department (HOSPITAL_COMMUNITY)
Admission: EM | Admit: 2015-07-10 | Discharge: 2015-07-10 | Disposition: A | Discharge status: Home / Self Care | Payer: BLUE CROSS/BLUE SHIELD | Attending: Emergency Medicine | Admitting: Emergency Medicine

## 2015-07-10 DIAGNOSIS — X102XXA Contact with fats and cooking oils, initial encounter: Secondary | ICD-10-CM | POA: Insufficient documentation

## 2015-07-10 DIAGNOSIS — Y939 Activity, unspecified: Secondary | ICD-10-CM | POA: Diagnosis not present

## 2015-07-10 DIAGNOSIS — F17210 Nicotine dependence, cigarettes, uncomplicated: Secondary | ICD-10-CM | POA: Diagnosis not present

## 2015-07-10 DIAGNOSIS — T24121A Burn of first degree of right knee, initial encounter: Secondary | ICD-10-CM | POA: Insufficient documentation

## 2015-07-10 DIAGNOSIS — Y92511 Restaurant or cafe as the place of occurrence of the external cause: Secondary | ICD-10-CM | POA: Insufficient documentation

## 2015-07-10 DIAGNOSIS — Y990 Civilian activity done for income or pay: Secondary | ICD-10-CM | POA: Insufficient documentation

## 2015-07-10 DIAGNOSIS — T310 Burns involving less than 10% of body surface: Secondary | ICD-10-CM | POA: Insufficient documentation

## 2015-07-10 DIAGNOSIS — T300 Burn of unspecified body region, unspecified degree: Secondary | ICD-10-CM

## 2015-07-10 MED ORDER — BACITRACIN ZINC 500 UNIT/GM EX OINT: 1.0000 "application " | TOPICAL_OINTMENT | Freq: Two times a day (BID) | CUTANEOUS | Status: DC

## 2015-07-10 MED ORDER — BACITRACIN 500 UNIT/GM EX OINT
1.0000 "application " | TOPICAL_OINTMENT | Freq: Two times a day (BID) | CUTANEOUS | Status: DC
  Administered 2015-07-10: 1 via TOPICAL
  Filled 2015-07-10 (×2): qty 0.9

## 2015-07-10 MED ORDER — NAPROXEN 250 MG PO TABS: 250.0000 mg | ORAL_TABLET | Freq: Two times a day (BID) | ORAL | Status: DC

## 2015-07-10 MED ORDER — TETANUS-DIPHTH-ACELL PERTUSSIS 5-2.5-18.5 LF-MCG/0.5 IM SUSP
0.5000 mL | Freq: Once | INTRAMUSCULAR | Status: AC
  Administered 2015-07-10: 0.5 mL via INTRAMUSCULAR
  Filled 2015-07-10: qty 0.5

## 2015-07-10 NOTE — ED Notes (Signed)
Pt c/o grease burn to right knee on Sunday. Wound base white, uneven edges due to greace splatter. No oozing, bleeding or blisters. Pt states there were blisters initially but blisters have since popped.

## 2015-07-10 NOTE — ED Provider Notes (Signed)
CSN: [id number]     Arrival date & time 07/10/15  [id number] History  By signing my name below, I, Placido SouLogan Joldersma, attest that this documentation has been prepared under the direction and in the presence of Everlene FarrierWilliam Pooja Camuso, PA-C. Electronically Signed: Placido SouLogan Joldersma, ED Scribe. 07/10/2015. 8:04 PM.   Chief Complaint  Patient presents with  . Burn   The history is provided by the patient. No language interpreter was used.    HPI Comments: Chloe Ortega is a 40 y.o. female who presents to the Emergency Department complaining of a mild grease burn just above her right knee which occurred 5 days ago. Pt works in [employer] and accidentally spilled grease on the affected region. She reports moderate, stinging, pain overlying the wound that worsens with palpation. She states she has cleaned the wound regularly and applied bacitracin and bandaging. Pt confirms having been ambulatory since the accident. Pt is unsure of the timing of her most recent tetanus vaccination. No knee pain.  She denies numbness, tingling, discharge from the wound or any other associated symptoms at this time.   Past Medical History  Diagnosis Date  . Hidradenitis axillaris    Past Surgical History  Procedure Laterality Date  . Abdominal hysterectomy     Family History  Problem Relation Age of Onset  . Hypertension Mother   . Diabetes Brother    Social History  Substance Use Topics  . Smoking status: Current Some Day Smoker    Types: Cigarettes  . Smokeless tobacco: Never Used  . Alcohol Use: 0.0 - 0.5 oz/week    0-1 drink(s) per week   OB History    No data available     Review of Systems  Constitutional: Negative for fever.  Musculoskeletal: Positive for myalgias. Negative for joint swelling.  Skin: Positive for color change and wound.  Neurological: Negative for weakness and numbness.    Allergies  Adhesive and Hydrocodone  Home Medications   Prior to Admission medications   Medication Sig Start  Date End Date Taking? Authorizing Provider  acetaminophen (TYLENOL) 500 MG tablet Take 1,000 mg by mouth 2 (two) times daily as needed for mild pain or headache.    Historical Provider, MD  bacitracin ointment Apply 1 application topically 2 (two) times daily. 07/10/15   Everlene FarrierWilliam Taleyah Hillman, PA-C  doxycycline (ADOXA) 150 MG tablet Take 50 mg by mouth 3 (three) times daily as needed (skin breakout).     Historical Provider, MD  naproxen (NAPROSYN) 250 MG tablet Take 1 tablet (250 mg total) by mouth 2 (two) times daily with a meal. 07/10/15   Everlene FarrierWilliam Aeon Koors, PA-C   BP 121/85 mmHg  Pulse 70  Temp(Src) 98.9 F (37.2 C) (Oral)  Resp 16  SpO2 98%   Physical Exam  Constitutional: She appears well-developed and well-nourished. No distress.  Nontoxic appearing.  HENT:  Head: Normocephalic and atraumatic.  Eyes: Right eye exhibits no discharge. Left eye exhibits no discharge.  Cardiovascular: Normal rate, regular rhythm and intact distal pulses.   Bilateral dorsalis pedis and posterior tibialis pulses are intact.  Pulmonary/Chest: Effort normal. No respiratory distress.  Musculoskeletal: Normal range of motion. She exhibits no tenderness.  Normal range of motion of her right knee without pain.  Neurological: She is alert. Coordination normal.  Skin: Skin is warm and dry. Burn noted. No rash noted. She is not diaphoretic. No erythema.  4 cm x 7 cm superficial partial thickness burn just superior to the right knee; wound is pink,  dry and well healing; no pus, discharge or surrounding erythema; epidermis intact; no pain with manipulation of the right knee. No evidence of infection.   Psychiatric: She has a normal mood and affect. Her behavior is normal.  Nursing note and vitals reviewed.  ED Course  Procedures  DIAGNOSTIC STUDIES: Oxygen Saturation is 98% on RA, normal by my interpretation.    COORDINATION OF CARE: 8:01 PM Discussed next steps with pt. Return precautions noted. Pt verbalized  understanding and is agreeable with the plan.   Labs Review Labs Reviewed - No data to display  Imaging Review No results found.   EKG Interpretation None      Filed Vitals:   07/10/15 1830  BP: 121/85  Pulse: 70  Temp: 98.9 F (37.2 C)  TempSrc: Oral  Resp: 16  SpO2: 98%     MDM   Meds given in ED:  Medications  bacitracin ointment 1 application (1 application Topical Given 07/10/15 2010)  Tdap (BOOSTRIX) injection 0.5 mL (0.5 mLs Intramuscular Given 07/10/15 2010)    New Prescriptions   BACITRACIN OINTMENT    Apply 1 application topically 2 (two) times daily.   NAPROXEN (NAPROSYN) 250 MG TABLET    Take 1 tablet (250 mg total) by mouth 2 (two) times daily with a meal.    Final diagnoses:  First degree burn   This is a 40 y.o. female who presents to the Emergency Department complaining of a mild grease burn just above her right knee which occurred 5 days ago. Pt works in [employer] and accidentally spilled grease on the affected region. She reports moderate, stinging, pain overlying the wound that worsens with palpation. She states she has cleaned the wound regularly and applied bacitracin and bandaging. Pt confirms having been ambulatory since the accident. Pt is unsure of the timing of her most recent tetanus vaccination. No knee pain. On examination patient is afebrile nontoxic appearing. She has as well healing superficial partial thickness burn just above her right knee. She has no pain to her knee with palpation or movement. No evidence of infection. No pus. No drainage. Burn appears to be healing well. Will update the patient's tetanus and have her apply bacitracin ointment to the wound. I discussed wound care. Will provide with naproxen for pain control. I encouraged her to follow-up with primary care. I discussed return precautions. I advised the patient to follow-up with their primary care provider this week. I advised the patient to return to the emergency  department with new or worsening symptoms or new concerns. The patient verbalized understanding and agreement with plan.    I personally performed the services described in this documentation, which was scribed in my presence. The recorded information has been reviewed and is accurate.       Everlene FarrierWilliam Dorita Rowlands, PA-C 07/10/15 2014  Mancel BaleElliott Wentz, MD 07/11/15 [phone]

## 2015-07-10 NOTE — Discharge Instructions (Signed)
Burn Care °Your skin is a natural barrier to infection. It is the largest organ of your body. Burns damage this natural protection. To help prevent infection, it is very important to follow your caregiver's instructions in the care of your burn. °Burns are classified as: °· First degree. There is only redness of the skin (erythema). No scarring is expected. °· Second degree. There is blistering of the skin. Scarring may occur with deeper burns. °· Third degree. All layers of the skin are injured, and scarring is expected. °HOME CARE INSTRUCTIONS  °· Wash your hands well before changing your bandage. °· Change your bandage as often as directed by your caregiver. °¨ Remove the old bandage. If the bandage sticks, you may soak it off with cool, clean water. °¨ Cleanse the burn thoroughly but gently with mild soap and water. °¨ Pat the area dry with a clean, dry cloth. °¨ Apply a thin layer of antibacterial cream to the burn. °¨ Apply a clean bandage as instructed by your caregiver. °¨ Keep the bandage as clean and dry as possible. °· Elevate the affected area for the first 24 hours, then as instructed by your caregiver. °· Only take over-the-counter or prescription medicines for pain, discomfort, or fever as directed by your caregiver. °SEEK IMMEDIATE MEDICAL CARE IF:  °· You develop excessive pain. °· You develop redness, tenderness, swelling, or red streaks near the burn. °· The burned area develops yellowish-white fluid (pus) or a bad smell. °· You have a fever. °MAKE SURE YOU:  °· Understand these instructions. °· Will watch your condition. °· Will get help right away if you are not doing well or get worse. °  °This information is not intended to replace advice given to you by your health care provider. Make sure you discuss any questions you have with your health care provider. °  °Document Released: 01/03/2005 Document Revised: 03/28/2011 Document Reviewed: 05/26/2010 °Elsevier Interactive Patient Education ©2016  Elsevier Inc. ° °

## 2018-03-26 ENCOUNTER — Encounter: Payer: Self-pay | Admitting: Emergency Medicine

## 2018-03-26 ENCOUNTER — Ambulatory Visit
Admission: EM | Admit: 2018-03-26 | Discharge: 2018-03-26 | Disposition: A | Discharge status: Home / Self Care | Payer: BLUE CROSS/BLUE SHIELD | Attending: Family Medicine | Admitting: Family Medicine

## 2018-03-26 DIAGNOSIS — K0889 Other specified disorders of teeth and supporting structures: Secondary | ICD-10-CM | POA: Diagnosis not present

## 2018-03-26 MED ORDER — IBUPROFEN 800 MG PO TABS: 800.0000 mg | ORAL_TABLET | Freq: Three times a day (TID) | ORAL | 0 refills | Status: DC | PRN

## 2018-03-26 MED ORDER — PENICILLIN V POTASSIUM 500 MG PO TABS: 500.0000 mg | ORAL_TABLET | Freq: Four times a day (QID) | ORAL | 0 refills | Status: AC

## 2018-03-26 NOTE — ED Notes (Signed)
Patient able to ambulate independently  

## 2018-03-26 NOTE — ED Triage Notes (Signed)
PT presents to [facility] for assessment of right lower dental pain.  States she has known the tooth needed to be pulled for approx 6 months.  States this flair up of intense pain began 2 weeks ago.  States taking Ibuprofen, leftover antibiotics, and clove oil for pain relief.

## 2018-03-26 NOTE — Discharge Instructions (Addendum)
Take ibuprofen 3 x a day with food for pain Take the penicillin as directed See your dentist as scheduled

## 2018-03-26 NOTE — ED Provider Notes (Signed)
[facility]    CSN: [id number] Arrival date & time: 03/26/18  1002     History   Chief Complaint Chief Complaint  Patient presents with  . Dental Pain    HPI NAJIYAH Ortega is a 43 y.o. female.   HPI  Patient is here for dental pain.  She states is been severe for the last couple weeks.  She is been taking ibuprofen 600 mg.  This is helped a little bit but not completely.  She thinks she needs an antibiotic.  She has scheduled an appointment with her dentist for next week.   Past Medical History:  Diagnosis Date  . Hidradenitis axillaris     Patient Active Problem List   Diagnosis Date Noted  . Hidradenitis suppurativa 01/04/2013  . Hidradenitis axillaris     Past Surgical History:  Procedure Laterality Date  . ABDOMINAL HYSTERECTOMY      OB History   No obstetric history on file.      Home Medications    Prior to Admission medications   Medication Sig Start Date End Date Taking? Authorizing Provider  doxycycline (ADOXA) 150 MG tablet Take 50 mg by mouth 3 (three) times daily as needed (skin breakout).    Yes [provider]  acetaminophen (TYLENOL) 500 MG tablet Take 1,000 mg by mouth 2 (two) times daily as needed for mild pain or headache.    [provider]  bacitracin ointment Apply 1 application topically 2 (two) times daily. 07/10/15   Everlene Farrier, PA-C  ibuprofen (ADVIL,MOTRIN) 800 MG tablet Take 1 tablet (800 mg total) by mouth every 8 (eight) hours as needed for moderate pain. 03/26/18   Eustace Moore, MD  naproxen (NAPROSYN) 250 MG tablet Take 1 tablet (250 mg total) by mouth 2 (two) times daily with a meal. 07/10/15   Everlene Farrier, PA-C  penicillin v potassium (VEETID) 500 MG tablet Take 1 tablet (500 mg total) by mouth 4 (four) times daily for 10 days. 03/26/18 04/05/18  Eustace Moore, MD    Family History Family History  Problem Relation Age of Onset  . Hypertension Mother   . Diabetes Brother      Social History Social History   Tobacco Use  . Smoking status: Current Some Day Smoker    Packs/day: 0.10    Types: Cigarettes  . Smokeless tobacco: Never Used  Substance Use Topics  . Alcohol use: Yes    Alcohol/week: 0.0 - 1.0 standard drinks  . Drug use: Yes    Frequency: 1.0 times per week    Types: Marijuana     Allergies   Adhesive [tape] and Hydrocodone   Review of Systems Review of Systems  Constitutional: Negative for chills and fever.  HENT: Positive for dental problem. Negative for ear pain and sore throat.   Eyes: Negative for pain and visual disturbance.  Respiratory: Negative for cough and shortness of breath.   Cardiovascular: Negative for chest pain and palpitations.  Gastrointestinal: Negative for abdominal pain and vomiting.  Genitourinary: Negative for dysuria and hematuria.  Musculoskeletal: Negative for arthralgias and back pain.  Skin: Negative for color change and rash.  Neurological: Negative for seizures and syncope.  All other systems reviewed and are negative.    Physical Exam Triage Vital Signs ED Triage Vitals [03/26/18 1009]  Enc Vitals Group     BP 131/68     Pulse Rate (!) 112     Resp 20     Temp 98.6  F (37 C)     Temp Source Oral     SpO2 98 %     Weight      Height      Head Circumference      Peak Flow      Pain Score 8     Pain Loc      Pain Edu?      Excl. in GC?    No data found.  Updated Vital Signs BP 131/68 (BP Location: Left Arm)   Pulse (!) 112   Temp 98.6 F (37 C) (Oral)   Resp 20   SpO2 98%       Physical Exam Constitutional:      General: She is not in acute distress.    Appearance: She is well-developed.  HENT:     Head: Normocephalic and atraumatic.     Mouth/Throat:   Eyes:     Conjunctiva/sclera: Conjunctivae normal.     Pupils: Pupils are equal, round, and reactive to light.  Neck:     Musculoskeletal: Normal range of motion.  Cardiovascular:     Rate and Rhythm: Normal rate.   Pulmonary:     Effort: Pulmonary effort is normal. No respiratory distress.  Abdominal:     General: There is no distension.     Palpations: Abdomen is soft.  Musculoskeletal: Normal range of motion.  Skin:    General: Skin is warm and dry.  Neurological:     Mental Status: She is alert.      UC Treatments / Results  Labs (all labs ordered are listed, but only abnormal results are displayed) Labs Reviewed - No data to display  EKG None  Radiology No results found.  Procedures Procedures (including critical care time)  Medications Ordered in UC Medications - No data to display  Initial Impression / Assessment and Plan / UC Course  I have reviewed the triage vital signs and the nursing notes.  Pertinent labs & imaging results that were available during my care of the patient were reviewed by me and considered in my medical decision making (see chart for details).      Final Clinical Impressions(s) / UC Diagnoses   Final diagnoses:  Pain, dental     Discharge Instructions     Take ibuprofen 3 x a day with food for pain Take the penicillin as directed See your dentist as scheduled    ED Prescriptions    Medication Sig Dispense Auth. Provider   ibuprofen (ADVIL,MOTRIN) 800 MG tablet Take 1 tablet (800 mg total) by mouth every 8 (eight) hours as needed for moderate pain. 90 tablet Eustace Moore, MD   penicillin v potassium (VEETID) 500 MG tablet Take 1 tablet (500 mg total) by mouth 4 (four) times daily for 10 days. 40 tablet Eustace Moore, MD     Controlled Substance Prescriptions [place] Controlled Substance Registry consulted? Not Applicable   Eustace Moore, MD 03/26/18 1145

## 2018-12-14 ENCOUNTER — Other Ambulatory Visit: Payer: Self-pay

## 2018-12-14 ENCOUNTER — Ambulatory Visit
Admission: EM | Admit: 2018-12-14 | Discharge: 2018-12-14 | Disposition: A | Discharge status: Home / Self Care | Payer: BC Managed Care – PPO | Attending: Emergency Medicine | Admitting: Emergency Medicine

## 2018-12-14 ENCOUNTER — Encounter: Payer: Self-pay | Admitting: Emergency Medicine

## 2018-12-14 DIAGNOSIS — L732 Hidradenitis suppurativa: Secondary | ICD-10-CM | POA: Diagnosis not present

## 2018-12-14 MED ORDER — IBUPROFEN 800 MG PO TABS: 800.0000 mg | ORAL_TABLET | Freq: Three times a day (TID) | ORAL | 0 refills | Status: AC | PRN

## 2018-12-14 MED ORDER — DOXYCYCLINE HYCLATE 100 MG PO CAPS: 100.0000 mg | ORAL_CAPSULE | Freq: Two times a day (BID) | ORAL | 0 refills | Status: DC

## 2018-12-14 NOTE — ED Triage Notes (Addendum)
Pt presents to [facility] for assessment of right axillar cyst(s) developing appox 1 week ago, hx of HS

## 2018-12-14 NOTE — Discharge Instructions (Signed)
Keep area(s) clean and dry. °Apply hot compress / towel for 5-10 minutes 3-5 times daily. °Take antibiotic as prescribed with food - important to complete course. °Return for worsening pain, redness, swelling, discharge, fever. ° °Helpful prevention tips: °Keep nails short to avoid secondary skin infections. °Use new, clean razors when shaving. °Avoid antiperspirants - look for deodorants without aluminum. °Avoid wearing underwire bras as this can irritate the area further.  °

## 2018-12-14 NOTE — ED Notes (Signed)
Patient able to ambulate independently  

## 2018-12-14 NOTE — ED Provider Notes (Signed)
[facility]    CSN: [id number] Arrival date & time: 12/14/18  1740      History   Chief Complaint Chief Complaint  Patient presents with  . Abscess    HPI Chloe Ortega is a 43 y.o. female with history of hidradenitis suppurativa presenting for right axillary lesions.  States she used to be on doxycycline daily as prescribed by her dermatologist, though due to Covid she has been unable to see her dermatologist, ran out of medications 1 month ago.  Patient has been using hot compresses, avoiding antiperspirant use without significant relief.  Past Medical History:  Diagnosis Date  . Hidradenitis axillaris     Patient Active Problem List   Diagnosis Date Noted  . Hidradenitis suppurativa 01/04/2013  . Hidradenitis axillaris     Past Surgical History:  Procedure Laterality Date  . ABDOMINAL HYSTERECTOMY      OB History   No obstetric history on file.      Home Medications    Prior to Admission medications   Medication Sig Start Date End Date Taking? Authorizing Provider  doxycycline (ADOXA) 150 MG tablet Take 50 mg by mouth 3 (three) times daily as needed (skin breakout).    Yes [provider]  acetaminophen (TYLENOL) 500 MG tablet Take 1,000 mg by mouth 2 (two) times daily as needed for mild pain or headache.    [provider]  bacitracin ointment Apply 1 application topically 2 (two) times daily. 07/10/15   Waynetta Pean, PA-C  doxycycline (VIBRAMYCIN) 100 MG capsule Take 1 capsule (100 mg total) by mouth 2 (two) times daily. 12/14/18   Hall-Potvin, [place], PA-C  ibuprofen (ADVIL) 800 MG tablet Take 1 tablet (800 mg total) by mouth every 8 (eight) hours as needed for up to 7 days for moderate pain. 12/14/18 12/21/18  Hall-Potvin, [place], PA-C  naproxen (NAPROSYN) 250 MG tablet Take 1 tablet (250 mg total) by mouth 2 (two) times daily with a meal. 07/10/15   Waynetta Pean, PA-C    Family History Family History  Problem  Relation Age of Onset  . Hypertension Mother   . Diabetes Brother     Social History Social History   Tobacco Use  . Smoking status: Current Some Day Smoker    Packs/day: 0.10    Types: Cigarettes  . Smokeless tobacco: Never Used  Substance Use Topics  . Alcohol use: Yes    Alcohol/week: 0.0 - 1.0 standard drinks  . Drug use: Yes    Frequency: 1.0 times per week    Types: Marijuana     Allergies   Adhesive [tape] and Hydrocodone   Review of Systems Review of Systems  Constitutional: Negative for fatigue and fever.  HENT: Negative for ear pain, sinus pain, sore throat and voice change.   Eyes: Negative for pain, redness and visual disturbance.  Respiratory: Negative for cough and shortness of breath.   Cardiovascular: Negative for chest pain and palpitations.  Gastrointestinal: Negative for abdominal pain, diarrhea and vomiting.  Musculoskeletal: Negative for arthralgias and myalgias.  Skin: Positive for wound. Negative for rash.  Neurological: Negative for syncope and headaches.     Physical Exam Triage Vital Signs ED Triage Vitals  Enc Vitals Group     BP 12/14/18 1747 (!) 151/80     Pulse Rate 12/14/18 1747 90     Resp 12/14/18 1747 16     Temp 12/14/18 1747 98.5 F (36.9 C)     Temp Source 12/14/18 1747 Oral  SpO2 12/14/18 1747 98 %     Weight --      Height --      Head Circumference --      Peak Flow --      Pain Score 12/14/18 1748 9     Pain Loc --      Pain Edu? --      Excl. in GC? --    No data found.  Updated Vital Signs BP (!) 151/80 (BP Location: Right Arm)   Pulse 90   Temp 98.5 F (36.9 C) (Oral)   Resp 16   SpO2 98%   Visual Acuity Right Eye Distance:   Left Eye Distance:   Bilateral Distance:    Right Eye Near:   Left Eye Near:    Bilateral Near:     Physical Exam Constitutional:      General: She is not in acute distress. HENT:     Head: Normocephalic and atraumatic.  Eyes:     General: No scleral icterus.     Pupils: Pupils are equal, round, and reactive to light.  Cardiovascular:     Rate and Rhythm: Normal rate.  Pulmonary:     Effort: Pulmonary effort is normal.  Musculoskeletal: Normal range of motion.        General: No swelling.  Skin:    Comments: Right axilla with 5 cm area of induration, scant central fluctuance.  Exquisitely TTP.  No overlying erythema, streaking, open wound or active discharge.  Neurological:     Mental Status: She is alert and oriented to person, place, and time.      UC Treatments / Results  Labs (all labs ordered are listed, but only abnormal results are displayed) Labs Reviewed - No data to display  EKG   Radiology No results found.  Procedures Incision and Drainage  Date/Time: 12/14/2018 6:29 PM Performed by: Shea EvansHall-Potvin, Brittany, PA-C Authorized by: Shea EvansHall-Potvin, Brittany, PA-C   Consent:    Consent obtained:  Verbal   Consent given by:  Patient   Risks discussed:  Bleeding, incomplete drainage, pain and damage to other organs   Alternatives discussed:  No treatment Universal protocol:    Patient identity confirmed:  Verbally with patient Location:    Type:  Abscess   Size:  5cm   Location:  Upper extremity   Upper extremity location: Right axilla. Pre-procedure details:    Skin preparation:  Betadine Anesthesia (see MAR for exact dosages):    Anesthesia method:  Local infiltration   Local anesthetic:  Lidocaine 2% WITH epi Procedure type:    Complexity:  Complex Procedure details:    Needle aspiration: no     Incision types:  Single straight   Incision depth:  Subcutaneous   Scalpel blade:  11   Wound management:  Probed and deloculated   Drainage:  Purulent and bloody   Drainage amount:  Copious   Wound treatment:  Wound left open   Packing materials:  None Post-procedure details:    Patient tolerance of procedure:  Tolerated well, no immediate complications   (including critical care time)  Medications Ordered in UC  Medications - No data to display  Initial Impression / Assessment and Plan / UC Course  I have reviewed the triage vital signs and the nursing notes.  Pertinent labs & imaging results that were available during my care of the patient were reviewed by me and considered in my medical decision making (see chart for details).     I&D  done successfully in office: Patient tolerated well.  Will start doxycycline, resume ibuprofen as needed for pain.  Return precautions discussed, patient verbalized understanding and is agreeable to plan. Final Clinical Impressions(s) / UC Diagnoses   Final diagnoses:  Hidradenitis suppurativa of right axilla     Discharge Instructions     Keep area(s) clean and dry. Apply hot compress / towel for 5-10 minutes 3-5 times daily. Take antibiotic as prescribed with food - important to complete course. Return for worsening pain, redness, swelling, discharge, fever.  Helpful prevention tips: Keep nails short to avoid secondary skin infections. Use new, clean razors when shaving. Avoid antiperspirants - look for deodorants without aluminum. Avoid wearing underwire bras as this can irritate the area further.     ED Prescriptions    Medication Sig Dispense Auth. Provider   doxycycline (VIBRAMYCIN) 100 MG capsule Take 1 capsule (100 mg total) by mouth 2 (two) times daily. 20 capsule Hall-Potvin, [place], PA-C   ibuprofen (ADVIL) 800 MG tablet Take 1 tablet (800 mg total) by mouth every 8 (eight) hours as needed for up to 7 days for moderate pain. 21 tablet Hall-Potvin, [place], PA-C     PDMP not reviewed this encounter.   Hall-Potvin, [place], [place] 12/14/18 1831

## 2019-03-23 ENCOUNTER — Ambulatory Visit: Payer: Self-pay | Attending: Internal Medicine

## 2019-03-23 DIAGNOSIS — Z23 Encounter for immunization: Secondary | ICD-10-CM | POA: Insufficient documentation

## 2019-03-23 NOTE — Progress Notes (Signed)
   Covid-19 Vaccination Clinic  Name:  Chloe Ortega    MRN: [id number] DOB: Apr 18, 1975  03/23/2019  [name] was observed post Covid-19 immunization for 15 minutes without incident. She was provided with Vaccine Information Sheet and instruction to access the V-Safe system.   [name] was instructed to call 911 with any severe reactions post vaccine: Marland Kitchen Difficulty breathing  . Swelling of face and throat  . A fast heartbeat  . A bad rash all over body  . Dizziness and weakness   Immunizations Administered    Name Date Dose VIS Date Route   Pfizer COVID-19 Vaccine 03/23/2019 12:39 PM 0.3 mL 12/28/2018 Intramuscular   Manufacturer: [employer], [employer]   Lot: [id number]   NDC: [address]-0

## 2019-04-13 ENCOUNTER — Ambulatory Visit: Payer: Self-pay | Attending: Internal Medicine

## 2019-04-13 DIAGNOSIS — Z23 Encounter for immunization: Secondary | ICD-10-CM

## 2019-04-13 NOTE — Progress Notes (Signed)
   Covid-19 Vaccination Clinic  Name:  Chloe Ortega    MRN: [id number] DOB: 12-13-1975  04/13/2019  Chloe Ortega was observed post Covid-19 immunization for 15 minutes without incident. She was provided with Vaccine Information Sheet and instruction to access the V-Safe system.   Chloe Ortega was instructed to call 911 with any severe reactions post vaccine: Marland Kitchen Difficulty breathing  . Swelling of face and throat  . A fast heartbeat  . A bad rash all over body  . Dizziness and weakness   Immunizations Administered    Name Date Dose VIS Date Route   Pfizer COVID-19 Vaccine 04/13/2019 12:20 PM 0.3 mL 12/28/2018 Intramuscular   Manufacturer: [employer], [employer]   Lot: [id number]   NDC: [address]-4

## 2019-07-16 ENCOUNTER — Ambulatory Visit
Admission: EM | Admit: 2019-07-16 | Discharge: 2019-07-16 | Disposition: A | Discharge status: Home / Self Care | Payer: BC Managed Care – PPO | Attending: Emergency Medicine | Admitting: Emergency Medicine

## 2019-07-16 ENCOUNTER — Encounter: Payer: Self-pay | Admitting: Emergency Medicine

## 2019-07-16 ENCOUNTER — Ambulatory Visit: Admission: EM | Admit: 2019-07-16 | Discharge: 2019-07-16 | Discharge status: Against Medical Advice | Payer: Self-pay

## 2019-07-16 ENCOUNTER — Other Ambulatory Visit: Payer: Self-pay

## 2019-07-16 DIAGNOSIS — K047 Periapical abscess without sinus: Secondary | ICD-10-CM | POA: Diagnosis not present

## 2019-07-16 MED ORDER — AMOXICILLIN-POT CLAVULANATE 875-125 MG PO TABS: 1.0000 | ORAL_TABLET | Freq: Two times a day (BID) | ORAL | 0 refills | Status: DC

## 2019-07-16 MED ORDER — MELOXICAM 15 MG PO TABS: 15.0000 mg | ORAL_TABLET | Freq: Every day | ORAL | 0 refills | Status: DC

## 2019-07-16 NOTE — ED Triage Notes (Signed)
Pt c/o dental pain and facial swelling since Friday, has a dentist appt July 13

## 2019-07-16 NOTE — Discharge Instructions (Signed)
Low-Cost Community Dental Resources:  [place] [facility] Address: [address], [place], [place], [address] Phone: [phone]  - Dr. Civils Address: [address], [place], [place], [address] Phone: [phone] 

## 2019-07-16 NOTE — ED Provider Notes (Signed)
[facility]    CSN: [id number] Arrival date & time: 07/16/19  1042      History   Chief Complaint Chief Complaint  Patient presents with  . Dental Pain  . Facial Swelling    HPI Chloe Ortega is a 44 y.o. female with history of at bedtime presenting for dental pain and facial swelling since Friday.  Patient has a dental appointment on July 13, though noticed right lower facial swelling over the last few days.  Patient denying drooling, difficulty breathing or swelling, chest pain, palpitations, fever.    Past Medical History:  Diagnosis Date  . Hidradenitis axillaris     Patient Active Problem List   Diagnosis Date Noted  . Hidradenitis suppurativa 01/04/2013  . Hidradenitis axillaris     Past Surgical History:  Procedure Laterality Date  . ABDOMINAL HYSTERECTOMY      OB History   No obstetric history on file.      Home Medications    Prior to Admission medications   Medication Sig Start Date End Date Taking? Authorizing Provider  acetaminophen (TYLENOL) 500 MG tablet Take 1,000 mg by mouth 2 (two) times daily as needed for mild pain or headache.    [provider]  amoxicillin-clavulanate (AUGMENTIN) 875-125 MG tablet Take 1 tablet by mouth every 12 (twelve) hours. 07/16/19   Hall-Potvin, [place], PA-C  bacitracin ointment Apply 1 application topically 2 (two) times daily. 07/10/15   Everlene Farrier, PA-C  doxycycline (ADOXA) 150 MG tablet Take 50 mg by mouth 3 (three) times daily as needed (skin breakout).     [provider]  doxycycline (VIBRAMYCIN) 100 MG capsule Take 1 capsule (100 mg total) by mouth 2 (two) times daily. 12/14/18   Hall-Potvin, [place], PA-C  meloxicam (MOBIC) 15 MG tablet Take 1 tablet (15 mg total) by mouth daily. 07/16/19   Hall-Potvin, [place], PA-C  naproxen (NAPROSYN) 250 MG tablet Take 1 tablet (250 mg total) by mouth 2 (two) times daily with a meal. 07/10/15   Everlene Farrier, PA-C    Family  History Family History  Problem Relation Age of Onset  . Hypertension Mother   . Diabetes Brother     Social History Social History   Tobacco Use  . Smoking status: Current Some Day Smoker    Packs/day: 0.10    Types: Cigarettes  . Smokeless tobacco: Never Used  Substance Use Topics  . Alcohol use: Yes    Alcohol/week: 0.0 - 1.0 standard drinks  . Drug use: Yes    Frequency: 1.0 times per week    Types: Marijuana     Allergies   Adhesive [tape] and Hydrocodone   Review of Systems As per HPI   Physical Exam Triage Vital Signs ED Triage Vitals  Enc Vitals Group     BP      Pulse      Resp      Temp      Temp src      SpO2      Weight      Height      Head Circumference      Peak Flow      Pain Score      Pain Loc      Pain Edu?      Excl. in GC?    No data found.  Updated Vital Signs BP 115/77   Pulse (!) 101   Temp 98 F (36.7 C)   Resp 16   SpO2  98%   Visual Acuity Right Eye Distance:   Left Eye Distance:   Bilateral Distance:    Right Eye Near:   Left Eye Near:    Bilateral Near:     Physical Exam Constitutional:      General: She is not in acute distress. HENT:     Head: Normocephalic and atraumatic.     Mouth/Throat:     Mouth: Mucous membranes are moist.     Pharynx: Oropharynx is clear.     Comments: Poor dentition with significant dental decay.  Patient does have diffuse right lower jaw swelling with gingival tenderness.  No abscess, fluctuance, loose teeth, discharge. Eyes:     General: No scleral icterus.    Pupils: Pupils are equal, round, and reactive to light.  Cardiovascular:     Rate and Rhythm: Normal rate.  Pulmonary:     Effort: Pulmonary effort is normal.  Musculoskeletal:     Cervical back: Neck supple. No tenderness.  Lymphadenopathy:     Cervical: Cervical adenopathy present.  Skin:    Coloration: Skin is not jaundiced or pale.  Neurological:     Mental Status: She is alert and oriented to person, place,  and time.      UC Treatments / Results  Labs (all labs ordered are listed, but only abnormal results are displayed) Labs Reviewed - No data to display  EKG   Radiology No results found.  Procedures Procedures (including critical care time)  Medications Ordered in UC Medications - No data to display  Initial Impression / Assessment and Plan / UC Course  I have reviewed the triage vital signs and the nursing notes.  Pertinent labs & imaging results that were available during my care of the patient were reviewed by me and considered in my medical decision making (see chart for details).     Afebrile, nontoxic, without airway compromise.  Will cover for infectious process with Augmentin, Mobic to help with inflammation and pain.  Provided low cost community dental resources and encouraged patient to keep her dental appointment on 7/13.  Return precautions discussed, patient verbalized understanding and is agreeable to plan. Final Clinical Impressions(s) / UC Diagnoses   Final diagnoses:  Dental infection     Discharge Instructions     Low-Cost Community Dental Resources:  [facility] Address: [address], [place], [place], [address] Phone: [phone]  - Dr. Lawrence Marseilles Address: [address] [place], [place], [address] Phone: [phone]    ED Prescriptions    Medication Sig Dispense Auth. Provider   amoxicillin-clavulanate (AUGMENTIN) 875-125 MG tablet Take 1 tablet by mouth every 12 (twelve) hours. 14 tablet Hall-Potvin, [place], PA-C   meloxicam (MOBIC) 15 MG tablet Take 1 tablet (15 mg total) by mouth daily. 14 tablet Hall-Potvin, [place], PA-C     I have reviewed the PDMP during this encounter.   Hall-Potvin, [place], [place] 07/16/19 1526

## 2019-10-05 ENCOUNTER — Telehealth: Payer: BC Managed Care – PPO | Admitting: Physician Assistant

## 2019-10-05 DIAGNOSIS — L732 Hidradenitis suppurativa: Secondary | ICD-10-CM

## 2019-10-05 MED ORDER — NAPROXEN 500 MG PO TABS: 500.0000 mg | ORAL_TABLET | Freq: Two times a day (BID) | ORAL | 0 refills | Status: DC

## 2019-10-05 MED ORDER — DOXYCYCLINE HYCLATE 100 MG PO TABS: 100.0000 mg | ORAL_TABLET | Freq: Two times a day (BID) | ORAL | 0 refills | Status: DC

## 2019-10-05 NOTE — Progress Notes (Signed)
E Visit for Rash  We are sorry that you are not feeling well. Here is how we plan to help!  Review of your records shows that you have hidradenitis suppurativa and you are stating you currently have a flare. I have prescribed doxycyline 100 mg one pill twice daily for 7 days. For pain, you may take the prescribed Naproxen 500 mg, one pill twice daily as needed.   HOME CARE:   Take cool showers and avoid direct sunlight.  Apply cool compress or wet dressings.  Take a bath in an oatmeal bath.  Sprinkle content of one Aveeno packet under running faucet with comfortably warm water.  Bathe for 15-20 minutes, 1-2 times daily.  Pat dry with a towel. Do not rub the rash.  Use hydrocortisone cream.  Take an antihistamine like Benadryl for widespread rashes that itch.  The adult dose of Benadryl is 25-50 mg by mouth 4 times daily.  Caution:  This type of medication may cause sleepiness.  Do not drink alcohol, drive, or operate dangerous machinery while taking antihistamines.  Do not take these medications if you have prostate enlargement.  Read package instructions thoroughly on all medications that you take.  GET HELP RIGHT AWAY IF:   Symptoms don't go away after treatment.  Severe itching that persists.  If you rash spreads or swells.  If you rash begins to smell.  If it blisters and opens or develops a yellow-brown crust.  You develop a fever.  You have a sore throat.  You become short of breath.  MAKE SURE YOU:  Understand these instructions. Will watch your condition. Will get help right away if you are not doing well or get worse.  Thank you for choosing an e-visit. Your e-visit answers were reviewed by a board certified advanced clinical practitioner to complete your personal care plan. Depending upon the condition, your plan could have included both over the counter or prescription medications. Please review your pharmacy choice. Be sure that the pharmacy you have  chosen is open so that you can pick up your prescription now.  If there is a problem you may message your provider in MyChart to have the prescription routed to another pharmacy. Your safety is important to [place]. If you have drug allergies check your prescription carefully.  For the next 24 hours, you can use MyChart to ask questions about today's visit, request a non-urgent call back, or ask for a work or school excuse from your e-visit provider. You will get an email in the next two days asking about your experience. I hope that your e-visit has been valuable and will speed your recovery.     I spent 5-10 minutes on review and completion of this note- Illa Level [facility]

## 2020-03-18 ENCOUNTER — Telehealth: Payer: BC Managed Care – PPO | Admitting: Family

## 2020-03-18 DIAGNOSIS — L732 Hidradenitis suppurativa: Secondary | ICD-10-CM

## 2020-03-18 MED ORDER — DOXYCYCLINE HYCLATE 100 MG PO TABS: 100.0000 mg | ORAL_TABLET | Freq: Two times a day (BID) | ORAL | 0 refills | Status: DC

## 2020-03-18 MED ORDER — SULFAMETHOXAZOLE-TRIMETHOPRIM 800-160 MG PO TABS: 1.0000 | ORAL_TABLET | Freq: Two times a day (BID) | ORAL | 0 refills | Status: DC

## 2020-03-18 MED ORDER — IBUPROFEN 800 MG PO TABS: 800.0000 mg | ORAL_TABLET | Freq: Three times a day (TID) | ORAL | 0 refills | Status: DC | PRN

## 2020-03-18 NOTE — Addendum Note (Signed)
Addended by: Jannifer Rodney A on: 03/18/2020 06:25 PM   Modules accepted: Orders

## 2020-03-18 NOTE — Progress Notes (Signed)
E Visit for Cellulitis ° °We are sorry that you are not feeling well. Here is how we plan to help! ° °Based on what you shared with me it looks like you have cellulitis.  Cellulitis looks like areas of skin redness, swelling, and warmth; it develops as a result of bacteria entering under the skin. Little red spots and/or bleeding can be seen in skin, and tiny surface sacs containing fluid can occur. Fever can be present. Cellulitis is almost always on one side of a body, and the lower limbs are the most common site of involvement.  ° °I have prescribed:  Bactrim DS 1 tablet by mouth twice a day for 7 days. ° °HOME CARE: ° °Take your medications as ordered and take all of them, even if the skin irritation appears to be healing.  ° °GET HELP RIGHT AWAY IF: ° °Symptoms that don't begin to go away within 48 hours. °Severe redness persists or worsens °If the area turns color, spreads or swells. °If it blisters and opens, develops yellow-brown crust or bleeds. °You develop a fever or chills. °If the pain increases or becomes unbearable.  °Are unable to keep fluids and food down. ° °MAKE SURE YOU  ° °Understand these instructions. °Will watch your condition. °Will get help right away if you are not doing well or get worse. ° °Thank you for choosing an e-visit. ° °Your e-visit answers were reviewed by a board certified advanced clinical practitioner to complete your personal care plan. Depending upon the condition, your plan could have included both over the counter or prescription medications. ° °Please review your pharmacy choice. Make sure the pharmacy is open so you can pick up prescription now. If there is a problem, you may contact your provider through MyChart messaging and have the prescription routed to another pharmacy.  Your safety is important to us. If you have drug allergies check your prescription carefully.  ° °For the next 24 hours you can use MyChart to ask questions about today's visit, request a  non-urgent call back, or ask for a work or school excuse. °You will get an email in the next two days asking about your experience. I hope that your e-visit has been valuable and will speed your recovery. ° °Approximately 5 minutes was spent documenting and reviewing patient's chart.  ° ° °

## 2020-05-06 ENCOUNTER — Telehealth: Payer: BC Managed Care – PPO | Admitting: Family

## 2020-05-06 DIAGNOSIS — K0889 Other specified disorders of teeth and supporting structures: Secondary | ICD-10-CM

## 2020-05-06 MED ORDER — IBUPROFEN 800 MG PO TABS: 800.0000 mg | ORAL_TABLET | Freq: Three times a day (TID) | ORAL | 0 refills | Status: DC | PRN

## 2020-05-06 MED ORDER — AMOXICILLIN 500 MG PO CAPS
500.0000 mg | ORAL_CAPSULE | Freq: Three times a day (TID) | ORAL | 0 refills | Status: DC
Start: 2020-05-06 — End: 2020-09-03

## 2020-05-06 NOTE — Progress Notes (Signed)
E-Visit for Dental Pain  We are sorry that you are not feeling well.  Here is how we plan to help!  Based on what you have shared with me in the questionnaire, it sounds like you have dental pain.  Ibuprofen 800mg  3 times a day for 7 days for discomfort and Amoxicillin 500mg  3 times per day for 10 days  It is imperative that you see a dentist within 10 days of this eVisit to determine the cause of the dental pain and be sure it is adequately treated  A toothache or tooth pain is caused when the nerve in the root of a tooth or surrounding a tooth is irritated. Dental (tooth) infection, decay, injury, or loss of a tooth are the most common causes of dental pain. Pain may also occur after an extraction (tooth is pulled out). Pain sometimes originates from other areas and radiates to the jaw, thus appearing to be tooth pain.Bacteria growing inside your mouth can contribute to gum disease and dental decay, both of which can cause pain. A toothache occurs from inflammation of the central portion of the tooth called pulp. The pulp contains nerve endings that are very sensitive to pain. Inflammation to the pulp or pulpitis may be caused by dental cavities, trauma, and infection.    HOME CARE:   For toothaches: . Over-the-counter pain medications such as acetaminophen or ibuprofen may be used. Take these as directed on the package while you arrange for a dental appointment. . Avoid very cold or hot foods, because they may make the pain worse. . You may get relief from biting on a cotton ball soaked in oil of cloves. You can get oil of cloves at most drug stores.  For jaw pain: .  Aspirin may be helpful for problems in the joint of the jaw in adults. . If pain happens every time you open your mouth widely, the temporomandibular joint (TMJ) may be the source of the pain. Yawning or taking a large bite of food may worsen the pain. An appointment with your doctor or dentist will help you find the cause.      GET HELP RIGHT AWAY IF:  . You have a high fever or chills . If you have had a recent head or face injury and develop headache, light headedness, nausea, vomiting, or other symptoms that concern you after an injury to your face or mouth, you could have a more serious injury in addition to your dental injury. . A facial rash associated with a toothache: This condition may improve with medication. Contact your doctor for them to decide what is appropriate. . Any jaw pain occurring with chest pain: Although jaw pain is most commonly caused by dental disease, it is sometimes referred pain from other areas. People with heart disease, especially people who have had stents placed, people with diabetes, or those who have had heart surgery may have jaw pain as a symptom of heart attack or angina. If your jaw or tooth pain is associated with lightheadedness, sweating, or shortness of breath, you should see a doctor as soon as possible. . Trouble swallowing or excessive pain or bleeding from gums: If you have a history of a weakened immune system, diabetes, or steroid use, you may be more susceptible to infections. Infections can often be more severe and extensive or caused by unusual organisms. Dental and gum infections in people with these conditions may require more aggressive treatment. An abscess may need draining or IV antibiotics, for  example.  MAKE SURE YOU    Understand these instructions.  Will watch your condition.  Will get help right away if you are not doing well or get worse.  Thank you for choosing an e-visit. Your e-visit answers were reviewed by a board certified advanced clinical practitioner to complete your personal care plan. Depending upon the condition, your plan could have included both over the counter or prescription medications. Please review your pharmacy choice. Make sure the pharmacy is open so you can pick up prescription now. If there is a problem, you may contact your  provider through [employer] and have the prescription routed to another pharmacy. Your safety is important to [place]. If you have drug allergies check your prescription carefully.  For the next 24 hours you can use MyChart to ask questions about today's visit, request a non-urgent call back, or ask for a work or school excuse. You will get an email in the next two days asking about your experience. I hope that your e-visit has been valuable and will speed your recovery.  Approximately 5 minutes was spent documenting and reviewing patient's chart.

## 2020-05-13 ENCOUNTER — Other Ambulatory Visit: Payer: Self-pay | Admitting: Family

## 2020-09-03 ENCOUNTER — Telehealth: Payer: BC Managed Care – PPO | Admitting: Physician Assistant

## 2020-09-03 DIAGNOSIS — L732 Hidradenitis suppurativa: Secondary | ICD-10-CM | POA: Diagnosis not present

## 2020-09-03 MED ORDER — DOXYCYCLINE HYCLATE 100 MG PO CAPS: 100.0000 mg | ORAL_CAPSULE | Freq: Two times a day (BID) | ORAL | 0 refills | Status: DC

## 2020-09-03 MED ORDER — IBUPROFEN 800 MG PO TABS: 800.0000 mg | ORAL_TABLET | Freq: Three times a day (TID) | ORAL | 0 refills | Status: DC | PRN

## 2020-09-03 NOTE — Progress Notes (Signed)
E Visit for Rash  We are sorry that you are not feeling well. Here is how we plan to help!  I am sorry you are having an acute flare of your hidradenitis. I have sent in a script for Doxycycline to take twice daily for 10 days. I have sent in the 800 mg dose of Ibuprofen to take no more than as directed. You can take OTC Tylenol between these doses if needed. Warm compresses to the area of concern. If things are not calming down/resolving you need an in-person evaluation.   HOME CARE:  Take cool showers and avoid direct sunlight. Apply cool compress or wet dressings. Take a bath in an oatmeal bath.  Sprinkle content of one Aveeno packet under running faucet with comfortably warm water.  Bathe for 15-20 minutes, 1-2 times daily.  Pat dry with a towel. Do not rub the rash. Use hydrocortisone cream. Take an antihistamine like Benadryl for widespread rashes that itch.  The adult dose of Benadryl is 25-50 mg by mouth 4 times daily. Caution:  This type of medication may cause sleepiness.  Do not drink alcohol, drive, or operate dangerous machinery while taking antihistamines.  Do not take these medications if you have prostate enlargement.  Read package instructions thoroughly on all medications that you take.  GET HELP RIGHT AWAY IF:  Symptoms don't go away after treatment. Severe itching that persists. If you rash spreads or swells. If you rash begins to smell. If it blisters and opens or develops a yellow-brown crust. You develop a fever. You have a sore throat. You become short of breath.  MAKE SURE YOU:  Understand these instructions. Will watch your condition. Will get help right away if you are not doing well or get worse.  Thank you for choosing an e-visit.  Your e-visit answers were reviewed by a board certified advanced clinical practitioner to complete your personal care plan. Depending upon the condition, your plan could have included both over the counter or prescription  medications.  Please review your pharmacy choice. Make sure the pharmacy is open so you can pick up prescription now. If there is a problem, you may contact your provider through [employer] and have the prescription routed to another pharmacy.  Your safety is important to [place]. If you have drug allergies check your prescription carefully.   For the next 24 hours you can use MyChart to ask questions about today's visit, request a non-urgent call back, or ask for a work or school excuse. You will get an email in the next two days asking about your experience. I hope that your e-visit has been valuable and will speed your recovery.

## 2020-09-03 NOTE — Progress Notes (Signed)
I have spent 5 minutes in review of e-visit questionnaire, review and updating patient chart, medical decision making and response to patient.   Zo Loudon Cody Raydell Maners, PA-C    

## 2021-01-13 ENCOUNTER — Telehealth: Payer: BC Managed Care – PPO | Admitting: Physician Assistant

## 2021-01-13 DIAGNOSIS — L732 Hidradenitis suppurativa: Secondary | ICD-10-CM

## 2021-01-13 MED ORDER — DOXYCYCLINE HYCLATE 100 MG PO TABS: 100.0000 mg | ORAL_TABLET | Freq: Two times a day (BID) | ORAL | 0 refills | Status: DC

## 2021-01-13 MED ORDER — IBUPROFEN 800 MG PO TABS: 800.0000 mg | ORAL_TABLET | Freq: Three times a day (TID) | ORAL | 0 refills | Status: DC

## 2021-01-13 NOTE — Progress Notes (Signed)
E Visit for Rash  We are sorry that you are not feeling well. Here is how we plan to help!  Based upon what you have shared with me it looks like you have a flare of your hidradenitis.  I am sorry to hear this as I know it is very uncomfortable.  I have prescribed: and Doxycycline 100 mg twice per day for 7 days   It seems like you have had several flares in the last year.  I would recommend following up with primary care to see if there are any preventative options that may help you.   HOME CARE:  Take cool showers and avoid direct sunlight. Apply cool compress or wet dressings. Take a bath in an oatmeal bath.  Sprinkle content of one Aveeno packet under running faucet with comfortably warm water.  Bathe for 15-20 minutes, 1-2 times daily.  Pat dry with a towel. Do not rub the rash. Use hydrocortisone cream. Take an antihistamine like Benadryl for widespread rashes that itch.  The adult dose of Benadryl is 25-50 mg by mouth 4 times daily. Caution:  This type of medication may cause sleepiness.  Do not drink alcohol, drive, or operate dangerous machinery while taking antihistamines.  Do not take these medications if you have prostate enlargement.  Read package instructions thoroughly on all medications that you take.  GET HELP RIGHT AWAY IF:  Symptoms don't go away after treatment. Severe itching that persists. If you rash spreads or swells. If you rash begins to smell. If it blisters and opens or develops a yellow-brown crust. You develop a fever. You have a sore throat. You become short of breath.  MAKE SURE YOU:  Understand these instructions. Will watch your condition. Will get help right away if you are not doing well or get worse.  Thank you for choosing an e-visit.  Your e-visit answers were reviewed by a board certified advanced clinical practitioner to complete your personal care plan. Depending upon the condition, your plan could have included both over the counter or  prescription medications.  Please review your pharmacy choice. Make sure the pharmacy is open so you can pick up prescription now. If there is a problem, you may contact your provider through [employer] and have the prescription routed to another pharmacy.  Your safety is important to [place]. If you have drug allergies check your prescription carefully.   For the next 24 hours you can use MyChart to ask questions about today's visit, request a non-urgent call back, or ask for a work or school excuse. You will get an email in the next two days asking about your experience. I hope that your e-visit has been valuable and will speed your recovery.  Greater than 5 minutes, yet less than 10 minutes of time have been spent researching, coordinating, and implementing care for this patient today

## 2021-01-13 NOTE — Addendum Note (Signed)
Addended by: Dierdre Forth on: 01/13/2021 02:58 PM   Modules accepted: Orders

## 2021-08-02 ENCOUNTER — Telehealth: Payer: BC Managed Care – PPO | Admitting: Physician Assistant

## 2021-08-02 DIAGNOSIS — L732 Hidradenitis suppurativa: Secondary | ICD-10-CM

## 2021-08-02 MED ORDER — IBUPROFEN 800 MG PO TABS: 800.0000 mg | ORAL_TABLET | Freq: Three times a day (TID) | ORAL | 0 refills | Status: DC | PRN

## 2021-08-02 MED ORDER — DOXYCYCLINE HYCLATE 100 MG PO TABS: 100.0000 mg | ORAL_TABLET | Freq: Two times a day (BID) | ORAL | 0 refills | Status: DC

## 2021-08-02 NOTE — Progress Notes (Signed)
E Visit for Rash  We are sorry that you are not feeling well. Here is how we plan to help!  Giving your flare of hidradenitis, I have sent in an antibiotic (Doxycycline) to take for infection along with Ibuprofen 800 mg to use as directed, when needed for pain.   HOME CARE:  Take cool showers and avoid direct sunlight. Apply cool compress or wet dressings. Take a bath in an oatmeal bath.  Sprinkle content of one Aveeno packet under running faucet with comfortably warm water.  Bathe for 15-20 minutes, 1-2 times daily.  Pat dry with a towel. Do not rub the rash. Use hydrocortisone cream. Take an antihistamine like Benadryl for widespread rashes that itch.  The adult dose of Benadryl is 25-50 mg by mouth 4 times daily. Caution:  This type of medication may cause sleepiness.  Do not drink alcohol, drive, or operate dangerous machinery while taking antihistamines.  Do not take these medications if you have prostate enlargement.  Read package instructions thoroughly on all medications that you take.  GET HELP RIGHT AWAY IF:  Symptoms don't go away after treatment. Severe itching that persists. If you rash spreads or swells. If you rash begins to smell. If it blisters and opens or develops a yellow-brown crust. You develop a fever. You have a sore throat. You become short of breath.  MAKE SURE YOU:  Understand these instructions. Will watch your condition. Will get help right away if you are not doing well or get worse.  Thank you for choosing an e-visit.  Your e-visit answers were reviewed by a board certified advanced clinical practitioner to complete your personal care plan. Depending upon the condition, your plan could have included both over the counter or prescription medications.  Please review your pharmacy choice. Make sure the pharmacy is open so you can pick up prescription now. If there is a problem, you may contact your provider through [employer] and have the  prescription routed to another pharmacy.  Your safety is important to [place]. If you have drug allergies check your prescription carefully.   For the next 24 hours you can use MyChart to ask questions about today's visit, request a non-urgent call back, or ask for a work or school excuse. You will get an email in the next two days asking about your experience. I hope that your e-visit has been valuable and will speed your recovery.

## 2021-08-02 NOTE — Progress Notes (Signed)
I have spent 5 minutes in review of e-visit questionnaire, review and updating patient chart, medical decision making and response to patient.   Meir Elwood Cody Prisha Hiley, PA-C    

## 2021-09-15 ENCOUNTER — Telehealth: Payer: BC Managed Care – PPO | Admitting: Physician Assistant

## 2021-09-15 DIAGNOSIS — T63441A Toxic effect of venom of bees, accidental (unintentional), initial encounter: Secondary | ICD-10-CM | POA: Diagnosis not present

## 2021-09-16 MED ORDER — HYDROXYZINE PAMOATE 25 MG PO CAPS: 25.0000 mg | ORAL_CAPSULE | Freq: Three times a day (TID) | ORAL | 0 refills | Status: DC | PRN

## 2021-09-16 NOTE — Progress Notes (Signed)
I have spent 5 minutes in review of e-visit questionnaire, review and updating patient chart, medical decision making and response to patient.   Aujanae Mccullum Cody Ophelia Sipe, PA-C    

## 2021-09-16 NOTE — Progress Notes (Signed)
E-Visit for Insect Sting  Thank you for describing the insect sting for [place].  Here is how we plan to help!  Based on the information you have shared with me you most likely have An uncomplicated insect sting that just occurred and can be closely followed using the instructions in your care plan.  The 2 greatest risks from insect stings are allergic reaction, which can be fatal in some people and infection, which is more common and less serious.  Bees, wasps, yellow jackets, and hornets belong to a class of insects called Hymenoptera.  Most insect stings cause only minor discomfort.  Stings can happen anywhere on the body and can be painful.  Most stings are from honey bees or yellow jackets.  Fire ants can sting multiple times.  The sites of the stings are more likely to become infected.    Based on your information I have: and Provided a home care guide for insect stings and instructions on when to call for help. I have also sent in a prescription antihistamine, hydroxyzine, to use as directed for swelling in the area from sting.  What can be used to prevent Insect Stings?  Insect repellant with at least 20% DEET.  Wearing long pants and shirts with socks and shoes.  Wear dark or drab-colored clothes rather than bright colors.  Avoid using perfumes and hair sprays; these attract insects.  HOME CARE ADVICE:  1. Stinger removal: The stinger looks like a tiny black dot in the sting. Use a fingernail, credit card edge, or knife-edge to scrape it off.  Don't pull it out because it squeezes out more venom. If the stinger is below the skin surface, leave it alone.  It will be shed with normal skin healing. 2. Use cold compresses to the area of the sting for 10-20 minutes.  You may repeat this as needed to relieve symptoms of pain and swelling. 3.  For pain relief, take acetominophen 650 mg 4-6 hours as needed or ibuprofen 400 mg every 6-8 hours as needed or naproxen 250-500 mg every 12 hours as  needed. 4.  You can also use hydrocortisone cream 0.5% or 1% up to 4 times daily as needed for itching. 5.  If the sting becomes very itchy, take Benadryl 25-50 mg, follow directions on box. 6.  Wash the area 2-3 times daily with antibacterial soap and warm water. 7. Call your Doctor if: Fever, a severe headache, or rash occur in the next 2 weeks. Sting area begins to look infected. Redness and swelling worsens after home treatment. Your current symptoms become worse.    MAKE SURE YOU:  Understand these instructions. Will watch your condition. Will get help right away if you are not doing well or get worse.  Thank you for choosing an e-visit.  Your e-visit answers were reviewed by a board certified advanced clinical practitioner to complete your personal care plan. Depending upon the condition, your plan could have included both over the counter or prescription medications.  Please review your pharmacy choice. Make sure the pharmacy is open so you can pick up prescription now. If there is a problem, you may contact your provider through [employer] and have the prescription routed to another pharmacy.  Your safety is important to [place]. If you have drug allergies check your prescription carefully.   For the next 24 hours you can use MyChart to ask questions about today's visit, request a non-urgent call back, or ask for a work or school  You will get an email in the next two days asking about your experience. I hope that your e-visit has been valuable and will speed your recovery.  

## 2022-01-15 ENCOUNTER — Telehealth: Payer: BC Managed Care – PPO | Admitting: Physician Assistant

## 2022-01-15 DIAGNOSIS — L732 Hidradenitis suppurativa: Secondary | ICD-10-CM | POA: Diagnosis not present

## 2022-01-15 MED ORDER — DOXYCYCLINE HYCLATE 100 MG PO TABS: 100.0000 mg | ORAL_TABLET | Freq: Two times a day (BID) | ORAL | 0 refills | Status: DC

## 2022-01-15 MED ORDER — IBUPROFEN 800 MG PO TABS: 800.0000 mg | ORAL_TABLET | Freq: Three times a day (TID) | ORAL | 0 refills | Status: DC | PRN

## 2022-01-15 NOTE — Progress Notes (Signed)
E Visit for Rash  We are sorry that you are not feeling well. Here is how we plan to help!  With having a recurrent flare of your HS we have prescribed Doxycycline 100mg  Take 1 tablet twice daily for 10 days. I have also sent in Ibuprofen 800mg  Take 1 tablet every 8 hours as needed for pain.    HOME CARE:  Take cool showers and avoid direct sunlight. Apply cool compress or wet dressings. Take a bath in an oatmeal bath.  Sprinkle content of one Aveeno packet under running faucet with comfortably warm water.  Bathe for 15-20 minutes, 1-2 times daily.  Pat dry with a towel. Do not rub the rash. Use hydrocortisone cream. Take an antihistamine like Benadryl for widespread rashes that itch.  The adult dose of Benadryl is 25-50 mg by mouth 4 times daily. Caution:  This type of medication may cause sleepiness.  Do not drink alcohol, drive, or operate dangerous machinery while taking antihistamines.  Do not take these medications if you have prostate enlargement.  Read package instructions thoroughly on all medications that you take.  GET HELP RIGHT AWAY IF:  Symptoms don't go away after treatment. Severe itching that persists. If you rash spreads or swells. If you rash begins to smell. If it blisters and opens or develops a yellow-brown crust. You develop a fever. You have a sore throat. You become short of breath.  MAKE SURE YOU:  Understand these instructions. Will watch your condition. Will get help right away if you are not doing well or get worse.  Thank you for choosing an e-visit.  Your e-visit answers were reviewed by a board certified advanced clinical practitioner to complete your personal care plan. Depending upon the condition, your plan could have included both over the counter or prescription medications.  Please review your pharmacy choice. Make sure the pharmacy is open so you can pick up prescription now. If there is a problem, you may contact your provider through  and have the prescription routed to another pharmacy.  Your safety is important to . If you have drug allergies check your prescription carefully.   For the next 24 hours you can use MyChart to ask questions about today's visit, request a non-urgent call back, or ask for a work or school excuse. You will get an email in the next two days asking about your experience. I hope that your e-visit has been valuable and will speed your recovery.  I have spent 5 minutes in review of e-visit questionnaire, review and updating patient chart, medical decision making and response to patient.   [employer], PA-C

## 2022-02-23 ENCOUNTER — Telehealth: Payer: Self-pay | Admitting: Physician Assistant

## 2022-02-23 DIAGNOSIS — R6889 Other general symptoms and signs: Secondary | ICD-10-CM

## 2022-02-23 MED ORDER — OSELTAMIVIR PHOSPHATE 75 MG PO CAPS: 75.0000 mg | ORAL_CAPSULE | Freq: Two times a day (BID) | ORAL | 0 refills | Status: DC

## 2022-02-23 NOTE — Progress Notes (Signed)
E visit for Flu like symptoms   We are sorry that you are not feeling well.  Here is how we plan to help! Based on what you have shared with me it looks like you may have a respiratory virus that may be influenza.  Influenza or "the flu" is   an infection caused by a respiratory virus. The flu virus is highly contagious and persons who did not receive their yearly flu vaccination may "catch" the flu from close contact.  We have anti-viral medications to treat the viruses that cause this infection. They are not a "cure" and only shorten the course of the infection. These prescriptions are most effective when they are given within the first 2 days of "flu" symptoms. Antiviral medication are indicated if you have a high risk of complications from the flu. You should  also consider an antiviral medication if you are in close contact with someone who is at risk. These medications can help patients avoid complications from the flu  but have side effects that you should know. Possible side effects from Tamiflu or oseltamivir include nausea, vomiting, diarrhea, dizziness, headaches, eye redness, sleep problems or other respiratory symptoms. You should not take Tamiflu if you have an allergy to oseltamivir or any to the ingredients in Tamiflu.  Based upon your symptoms and potential risk factors I have prescribed Oseltamivir (Tamiflu).  It has been sent to your designated pharmacy.  You will take one 75 mg capsule orally twice a day for the next 5 days. and I recommend that you follow the flu symptoms recommendation that I have listed below.  Please keep well-hydrated and try to get plenty of rest. If you have a humidifier, place it in the bedroom and run it at night. Start a saline nasal rinse for nasal congestion. You can consider use of a nasal steroid spray like Flonase or Nasacort OTC. You can alternate between Tylenol and Ibuprofen if needed for fever, body aches, headache and/or throat pain. Salt  water-gargles and chloraseptic spray can be very beneficial for sore throat. Mucinex-DM for congestion or cough. Please take all prescribed medications as directed.  Remain out of work until fever-free for 24 hours without a fever-reducing medication, and you are feeling better.  You should mask until symptoms are resolved.  If anything worsens despite treatment, you need to be evaluated in-person. Please do not delay care.   ANYONE WHO HAS FLU SYMPTOMS SHOULD: Stay home. The flu is highly contagious and going out or to work exposes others! Be sure to drink plenty of fluids. Water is fine as well as fruit juices, sodas and electrolyte beverages. You may want to stay away from caffeine or alcohol. If you are nauseated, try taking small sips of liquids. How do you know if you are getting enough fluid? Your urine should be a pale yellow or almost colorless. Get rest. Taking a steamy shower or using a humidifier may help nasal congestion and ease sore throat pain. Using a saline nasal spray works much the same way. Cough drops, hard candies and sore throat lozenges may ease your cough. Line up a caregiver. Have someone check on you regularly.   GET HELP RIGHT AWAY IF: You cannot keep down liquids or your medications. You become short of breath Your fell like you are going to pass out or loose consciousness. Your symptoms persist after you have completed your treatment plan MAKE SURE YOU  Understand these instructions. Will watch your condition. Will get help right   away if you are not doing well or get worse.  Your e-visit answers were reviewed by a board certified advanced clinical practitioner to complete your personal care plan.  Depending on the condition, your plan could have included both over the counter or prescription medications.  If there is a problem please reply  once you have received a response from your provider.  Your safety is important to us.  If you have drug allergies  check your prescription carefully.    You can use MyChart to ask questions about today's visit, request a non-urgent call back, or ask for a work or school excuse for 24 hours related to this e-Visit. If it has been greater than 24 hours you will need to follow up with your provider, or enter a new e-Visit to address those concerns.  You will get an e-mail in the next two days asking about your experience.  I hope that your e-visit has been valuable and will speed your recovery. Thank you for using e-visits.  

## 2022-02-23 NOTE — Progress Notes (Signed)
I have spent 5 minutes in review of e-visit questionnaire, review and updating patient chart, medical decision making and response to patient.   Iverna Hammac Cody Khadijatou Borak, PA-C    

## 2022-05-08 ENCOUNTER — Telehealth: Payer: Self-pay | Admitting: Physician Assistant

## 2022-05-08 ENCOUNTER — Telehealth: Payer: Self-pay | Admitting: Family Medicine

## 2022-05-08 DIAGNOSIS — R21 Rash and other nonspecific skin eruption: Secondary | ICD-10-CM

## 2022-05-08 DIAGNOSIS — L732 Hidradenitis suppurativa: Secondary | ICD-10-CM

## 2022-05-08 NOTE — Progress Notes (Signed)
   Thank you for the details you included in the comment boxes. Those details are very helpful in determining the best course of treatment for you and help [place] to provide the best care.Because Chloe Ortega, we recommend that you convert this visit to a video visit in order for the provider to better assess what is going on.  The provider will be able to give you a more accurate diagnosis and treatment plan if we can more freely discuss your symptoms and with the addition of a virtual examination.   If you convert to a video visit, we will bill your insurance (similar to an office visit) and you will not be charged for this e-Visit. You will be able to stay at home and speak with the first available [facility] advanced practice provider. The link to do a video visit is in the drop down Menu tab of your Welcome screen in MyChart.

## 2022-05-09 MED ORDER — DOXYCYCLINE HYCLATE 100 MG PO TABS: 100.0000 mg | ORAL_TABLET | Freq: Two times a day (BID) | ORAL | 0 refills | Status: DC

## 2022-05-09 NOTE — Progress Notes (Signed)
E Visit for Boils  We are sorry that you are not feeling well. Here is how we plan to help!  Based on what you shared with me it looks like you have boils secondary to HS.  I have prescribed:  Doxycycline $RemoveBefor tablet by mouth twice daily for 10 days.  HOME CARE:  Take your medications as ordered and take all of them, even if the skin irritation appears to be healing.   GET HELP RIGHT AWAY IF:  Symptoms that don't begin to go away within 48 hours. Severe redness persists or worsens If the area turns color, spreads or swells. If it blisters and opens, develops yellow-brown crust or bleeds. You develop a fever or chills. If the pain increases or becomes unbearable.  Are unable to keep fluids and food down.  MAKE SURE YOU   Understand these instructions. Will watch your condition. Will get help right away if you are not doing well or get worse.  Thank you for choosing an e-visit.  Your e-visit answers were reviewed by a board certified advanced clinical practitioner to complete your personal care plan. Depending upon the condition, your plan could have included both over the counter or prescription medications.  Please review your pharmacy choice. Make sure the pharmacy is open so you can pick up prescription now. If there is a problem, you may contact your provider through [employer] and have the prescription routed to another pharmacy.  Your safety is important to [place]. If you have drug allergies check your prescription carefully.   For the next 24 hours you can use MyChart to ask questions about today's visit, request a non-urgent call back, or ask for a work or school excuse. You will get an email in the next two days asking about your experience. I hope that your e-visit has been valuable and will speed your recovery.  I have spent 5 minutes in review of e-visit questionnaire, review and updating patient chart, medical decision making and response to patient.    Margaretann Loveless, PA-C

## 2022-05-19 ENCOUNTER — Ambulatory Visit
Admission: RE | Admit: 2022-05-19 | Discharge: 2022-05-19 | Disposition: A | Discharge status: Home / Self Care | Payer: No Typology Code available for payment source | Source: Ambulatory Visit | Attending: Urgent Care | Admitting: Urgent Care

## 2022-05-19 VITALS — BP 149/88 | HR 110 | Temp 98.6°F | Resp 20

## 2022-05-19 DIAGNOSIS — Z113 Encounter for screening for infections with a predominantly sexual mode of transmission: Secondary | ICD-10-CM | POA: Diagnosis present

## 2022-05-19 DIAGNOSIS — Z202 Contact with and (suspected) exposure to infections with a predominantly sexual mode of transmission: Secondary | ICD-10-CM | POA: Diagnosis present

## 2022-05-19 MED ORDER — METRONIDAZOLE 500 MG PO TABS: 500.0000 mg | ORAL_TABLET | Freq: Two times a day (BID) | ORAL | 0 refills | Status: DC

## 2022-05-19 NOTE — ED Provider Notes (Signed)
[name]  Note:  This document was prepared using Conservation officer, historic buildings and may include unintentional dictation errors.  MRN: [id number] DOB: Jul 31, 1975  Subjective:   Chloe Ortega is a 47 y.o. female presenting for STI treatment. Had exposure to trichomonas from a sex partner. Is currently taking doxycycline for a boil. Denies fever, n/v, abdominal pain, pelvic pain, rashes, dysuria, urinary frequency, hematuria, vaginal discharge.    No current facility-administered medications for this encounter.  Current Outpatient Medications:    doxycycline (VIBRA-TABS) 100 MG tablet, Take 1 tablet (100 mg total) by mouth 2 (two) times daily., Disp: 20 tablet, Rfl: 0   Allergies  Allergen Reactions   Adhesive [Tape] Other (See Comments)    Peels skin   Hydrocodone Other (See Comments)    Constipation    Past Medical History:  Diagnosis Date   Hidradenitis axillaris      Past Surgical History:  Procedure Laterality Date   ABDOMINAL HYSTERECTOMY      Family History  Problem Relation Age of Onset   Hypertension Mother    Diabetes Brother     Social History   Tobacco Use   Smoking status: Some Days    Packs/day: .1    Types: Cigarettes   Smokeless tobacco: Never  Vaping Use   Vaping Use: Some days  Substance Use Topics   Alcohol use: Not Currently    Comment: occ   Drug use: Yes    Types: Marijuana    ROS   Objective:   Vitals: BP (!) 149/88 (BP Location: Right Arm)   Pulse (!) 110   Temp 98.6 F (37 C) (Oral)   Resp 20   SpO2 98%   Physical Exam Constitutional:      General: She is not in acute distress.    Appearance: Normal appearance. She is well-developed. She is not ill-appearing, toxic-appearing or diaphoretic.  HENT:     Head: Normocephalic and atraumatic.     Nose: Nose normal.     Mouth/Throat:     Mouth: Mucous membranes are moist.  Eyes:     General: No scleral icterus.       Right eye: No  discharge.        Left eye: No discharge.     Extraocular Movements: Extraocular movements intact.     Conjunctiva/sclera: Conjunctivae normal.  Cardiovascular:     Rate and Rhythm: Normal rate.  Pulmonary:     Effort: Pulmonary effort is normal.  Abdominal:     General: Bowel sounds are normal. There is no distension.     Palpations: Abdomen is soft. There is no mass.     Tenderness: There is no abdominal tenderness. There is no right CVA tenderness, left CVA tenderness, guarding or rebound.  Skin:    General: Skin is warm and dry.  Neurological:     General: No focal deficit present.     Mental Status: She is alert and oriented to person, place, and time.  Psychiatric:        Mood and Affect: Mood normal.        Behavior: Behavior normal.        Thought Content: Thought content normal.        Judgment: Judgment normal.     Assessment and Plan :   PDMP not reviewed this encounter.  1. Trichomonas exposure   2. Screen for STD (sexually transmitted disease)    Will treat empirically for trichomonas with Flagyl.  Labs pending, will treat as appropriate otherwise. Counseled patient on potential for adverse effects with medications prescribed/recommended today, ER and return-to-clinic precautions discussed, patient verbalized understanding.    Wallis Bamberg, PA-C 05/19/22 1318

## 2022-05-19 NOTE — Discharge Instructions (Signed)
Avoid all forms of sexual intercourse (oral, vaginal, anal) for the next 7 days to avoid spreading/reinfecting or at least until we can see what kinds of infection results are positive.  Abstaining for 2 weeks would be better but at least 1 week is required.  We will let you know about your test results from the swab we did today and if you need any prescriptions for antibiotics or changes to your treatment from today.  You can retest to see if you cleared the infection in 5-6 weeks.

## 2022-05-19 NOTE — ED Triage Notes (Signed)
Pt requesting STD screening-reports exposure to trichomonas-denies sx-NAD-steady gait

## 2022-05-20 LAB — CERVICOVAGINAL ANCILLARY ONLY
Chlamydia: NEGATIVE
Comment: NEGATIVE
Comment: NEGATIVE
Comment: NORMAL
Neisseria Gonorrhea: NEGATIVE
Trichomonas: POSITIVE — AB

## 2022-07-06 ENCOUNTER — Telehealth: Payer: No Typology Code available for payment source | Admitting: Nurse Practitioner

## 2022-07-06 DIAGNOSIS — K047 Periapical abscess without sinus: Secondary | ICD-10-CM

## 2022-07-06 MED ORDER — NAPROXEN 500 MG PO TABS
500.0000 mg | ORAL_TABLET | Freq: Two times a day (BID) | ORAL | 0 refills | Status: AC | PRN
Start: 2022-07-06 — End: 2022-07-16

## 2022-07-06 MED ORDER — PENICILLIN V POTASSIUM 500 MG PO TABS
500.0000 mg | ORAL_TABLET | Freq: Three times a day (TID) | ORAL | 0 refills | Status: AC
Start: 2022-07-06 — End: 2022-07-13

## 2022-07-06 NOTE — Progress Notes (Signed)
E-Visit for Dental Pain  We are sorry that you are not feeling well.  Here is how we plan to help!  Based on what you have shared with me in the questionnaire, it sounds like you have an infection under one of your teeth   Pen VK 500mg 3 times a day for 7 days  It is imperative that you see a dentist within 10 days of this eVisit to determine the cause of the dental pain and be sure it is adequately treated  A toothache or tooth pain is caused when the nerve in the root of a tooth or surrounding a tooth is irritated. Dental (tooth) infection, decay, injury, or loss of a tooth are the most common causes of dental pain. Pain may also occur after an extraction (tooth is pulled out). Pain sometimes originates from other areas and radiates to the jaw, thus appearing to be tooth pain.Bacteria growing inside your mouth can contribute to gum disease and dental decay, both of which can cause pain. A toothache occurs from inflammation of the central portion of the tooth called pulp. The pulp contains nerve endings that are very sensitive to pain. Inflammation to the pulp or pulpitis may be caused by dental cavities, trauma, and infection.    HOME CARE:   For toothaches: Over-the-counter pain medications such as acetaminophen or ibuprofen may be used. Take these as directed on the package while you arrange for a dental appointment. Avoid very cold or hot foods, because they may make the pain worse. You may get relief from biting on a cotton ball soaked in oil of cloves. You can get oil of cloves at most drug stores.  For jaw pain:  Aspirin may be helpful for problems in the joint of the jaw in adults. If pain happens every time you open your mouth widely, the temporomandibular joint (TMJ) may be the source of the pain. Yawning or taking a large bite of food may worsen the pain. An appointment with your doctor or dentist will help you find the cause.     GET HELP RIGHT AWAY IF:  You have a high fever  or chills If you have had a recent head or face injury and develop headache, light headedness, nausea, vomiting, or other symptoms that concern you after an injury to your face or mouth, you could have a more serious injury in addition to your dental injury. A facial rash associated with a toothache: This condition may improve with medication. Contact your doctor for them to decide what is appropriate. Any jaw pain occurring with chest pain: Although jaw pain is most commonly caused by dental disease, it is sometimes referred pain from other areas. People with heart disease, especially people who have had stents placed, people with diabetes, or those who have had heart surgery may have jaw pain as a symptom of heart attack or angina. If your jaw or tooth pain is associated with lightheadedness, sweating, or shortness of breath, you should see a doctor as soon as possible. Trouble swallowing or excessive pain or bleeding from gums: If you have a history of a weakened immune system, diabetes, or steroid use, you may be more susceptible to infections. Infections can often be more severe and extensive or caused by unusual organisms. Dental and gum infections in people with these conditions may require more aggressive treatment. An abscess may need draining or IV antibiotics, for example.  MAKE SURE YOU   Understand these instructions. Will watch your condition. Will   get help right away if you are not doing well or get worse.  Thank you for choosing an e-visit.  Your e-visit answers were reviewed by a board certified advanced clinical practitioner to complete your personal care plan. Depending upon the condition, your plan could have included both over the counter or prescription medications.  Please review your pharmacy choice. Make sure the pharmacy is open so you can pick up prescription now. If there is a problem, you may contact your provider through MyChart messaging and have the prescription routed  to another pharmacy.  Your safety is important to us. If you have drug allergies check your prescription carefully.   For the next 24 hours you can use MyChart to ask questions about today's visit, request a non-urgent call back, or ask for a work or school excuse. You will get an email in the next two days asking about your experience. I hope that your e-visit has been valuable and will speed your recovery.   Meds ordered this encounter  Medications   penicillin v potassium (VEETID) 500 MG tablet    Sig: Take 1 tablet (500 mg total) by mouth 3 (three) times daily for 7 days.    Dispense:  21 tablet    Refill:  0    I spent approximately 5 minutes reviewing the patient's history, current symptoms and coordinating their care today.   

## 2022-07-06 NOTE — Addendum Note (Signed)
Addended by: Viviano Simas E on: 07/06/2022 10:05 AM   Modules accepted: Orders

## 2022-10-06 ENCOUNTER — Telehealth: Payer: No Typology Code available for payment source | Admitting: Physician Assistant

## 2022-10-06 DIAGNOSIS — L732 Hidradenitis suppurativa: Secondary | ICD-10-CM | POA: Diagnosis not present

## 2022-10-06 MED ORDER — DOXYCYCLINE HYCLATE 100 MG PO TABS
100.0000 mg | ORAL_TABLET | Freq: Two times a day (BID) | ORAL | 0 refills | Status: DC
Start: 2022-10-06 — End: 2023-12-01

## 2022-10-06 NOTE — Progress Notes (Signed)
E Visit for Boils  We are sorry that you are not feeling well. Here is how we plan to help!  Based on what you shared with me it looks like you have boils secondary to HS.  I have prescribed:  Doxycycline 100mg  Take 1 tablet by mouth twice daily for 10 days.  HOME CARE:  Take your medications as ordered and take all of them, even if the skin irritation appears to be healing.   GET HELP RIGHT AWAY IF:  Symptoms that don't begin to go away within 48 hours. Severe redness persists or worsens If the area turns color, spreads or swells. If it blisters and opens, develops yellow-brown crust or bleeds. You develop a fever or chills. If the pain increases or becomes unbearable.  Are unable to keep fluids and food down.  MAKE SURE YOU   Understand these instructions. Will watch your condition. Will get help right away if you are not doing well or get worse.  Thank you for choosing an e-visit.  Your e-visit answers were reviewed by a board certified advanced clinical practitioner to complete your personal care plan. Depending upon the condition, your plan could have included both over the counter or prescription medications.  Please review your pharmacy choice. Make sure the pharmacy is open so you can pick up prescription now. If there is a problem, you may contact your provider through [employer] and have the prescription routed to another pharmacy.  Your safety is important to [place]. If you have drug allergies check your prescription carefully.   For the next 24 hours you can use MyChart to ask questions about today's visit, request a non-urgent call back, or ask for a work or school excuse. You will get an email in the next two days asking about your experience. I hope that your e-visit has been valuable and will speed your recovery.  I have spent 5 minutes in review of e-visit questionnaire, review and updating patient chart, medical decision making and response to patient.    Margaretann Loveless, PA-C

## 2023-01-08 ENCOUNTER — Telehealth: Payer: No Typology Code available for payment source | Admitting: Family Medicine

## 2023-01-08 DIAGNOSIS — K047 Periapical abscess without sinus: Secondary | ICD-10-CM

## 2023-01-08 MED ORDER — PENICILLIN V POTASSIUM 500 MG PO TABS: 500.0000 mg | ORAL_TABLET | Freq: Three times a day (TID) | ORAL | 0 refills | Status: AC

## 2023-01-08 MED ORDER — NAPROXEN 500 MG PO TABS: 500.0000 mg | ORAL_TABLET | Freq: Two times a day (BID) | ORAL | 0 refills | Status: AC

## 2023-01-08 NOTE — Progress Notes (Signed)

## 2023-05-12 ENCOUNTER — Telehealth: Admitting: Family Medicine

## 2023-05-12 DIAGNOSIS — J019 Acute sinusitis, unspecified: Secondary | ICD-10-CM

## 2023-05-12 DIAGNOSIS — B9689 Other specified bacterial agents as the cause of diseases classified elsewhere: Secondary | ICD-10-CM | POA: Diagnosis not present

## 2023-05-12 DIAGNOSIS — J301 Allergic rhinitis due to pollen: Secondary | ICD-10-CM | POA: Diagnosis not present

## 2023-05-12 MED ORDER — FLUTICASONE PROPIONATE 50 MCG/ACT NA SUSP: 2.0000 | Freq: Every day | NASAL | 0 refills | Status: AC

## 2023-05-12 MED ORDER — AMOXICILLIN-POT CLAVULANATE 875-125 MG PO TABS: 1.0000 | ORAL_TABLET | Freq: Two times a day (BID) | ORAL | 0 refills | Status: DC

## 2023-05-12 MED ORDER — PREDNISONE 20 MG PO TABS: 20.0000 mg | ORAL_TABLET | Freq: Two times a day (BID) | ORAL | 0 refills | Status: AC

## 2023-05-12 NOTE — Addendum Note (Signed)
 Addended by: Albertha Huger on: 05/12/2023 06:41 PM   Modules accepted: Orders

## 2023-05-12 NOTE — Progress Notes (Signed)
 E-Visit for Sinus Problems  We are sorry that you are not feeling well.  Here is how we plan to help!  Based on what you have shared with me it looks like you have sinusitis.  Sinusitis is inflammation and infection in the sinus cavities of the head.  Based on your presentation I believe you most likely have Acute Bacterial Sinusitis.  This is an infection caused by bacteria and is treated with antibiotics. I have prescribed Augmentin 875mg /125mg  one tablet twice daily with food, for 7 days. You may use an oral decongestant such as Mucinex D or if you have glaucoma or high blood pressure use plain Mucinex. Saline nasal spray help and can safely be used as often as needed for congestion.  If you develop worsening sinus pain, fever or notice severe headache and vision changes, or if symptoms are not better after completion of antibiotic, please schedule an appointment with a health care provider.    I will also send prednisone.   Sinus infections are not as easily transmitted as other respiratory infection, however we still recommend that you avoid close contact with loved ones, especially the very young and elderly.  Remember to wash your hands thoroughly throughout the day as this is the number one way to prevent the spread of infection!  Home Care: Only take medications as instructed by your medical team. Complete the entire course of an antibiotic. Do not take these medications with alcohol. A steam or ultrasonic humidifier can help congestion.  You can place a towel over your head and breathe in the steam from hot water coming from a faucet. Avoid close contacts especially the very young and the elderly. Cover your mouth when you cough or sneeze. Always remember to wash your hands.  Get Help Right Away If: You develop worsening fever or sinus pain. You develop a severe head ache or visual changes. Your symptoms persist after you have completed your treatment plan.  Make sure  you Understand these instructions. Will watch your condition. Will get help right away if you are not doing well or get worse.  Thank you for choosing an e-visit.  Your e-visit answers were reviewed by a board certified advanced clinical practitioner to complete your personal care plan. Depending upon the condition, your plan could have included both over the counter or prescription medications.  Please review your pharmacy choice. Make sure the pharmacy is open so you can pick up prescription now. If there is a problem, you may contact your provider through [employer] and have the prescription routed to another pharmacy.  Your safety is important to [place]. If you have drug allergies check your prescription carefully.   For the next 24 hours you can use MyChart to ask questions about today's visit, request a non-urgent call back, or ask for a work or school excuse. You will get an email in the next two days asking about your experience. I hope that your e-visit has been valuable and will speed your recovery.    have provided 5 minutes of non face to face time during this encounter for chart review and documentation.

## 2023-07-12 ENCOUNTER — Telehealth: Admitting: Physician Assistant

## 2023-07-12 DIAGNOSIS — K0889 Other specified disorders of teeth and supporting structures: Secondary | ICD-10-CM

## 2023-07-12 MED ORDER — MELOXICAM 7.5 MG PO TABS: 7.5000 mg | ORAL_TABLET | Freq: Every day | ORAL | 0 refills | Status: AC

## 2023-07-12 MED ORDER — AMOXICILLIN-POT CLAVULANATE 875-125 MG PO TABS: 1.0000 | ORAL_TABLET | Freq: Two times a day (BID) | ORAL | 0 refills | Status: AC

## 2023-07-12 NOTE — Progress Notes (Signed)
 E-Visit for Dental Pain  We are sorry that you are not feeling well.  Here is how we plan to help!  Based on what you have shared with me in the questionnaire, it sounds like you have an infection from a broken tooth  Augmentin  875-125mg  twice a day for 7 days. I have also prescribed Meloxicam  to take for pain.   It is imperative that you see a dentist within 10 days of this eVisit to determine the cause of the dental pain and be sure it is adequately treated  A toothache or tooth pain is caused when the nerve in the root of a tooth or surrounding a tooth is irritated. Dental (tooth) infection, decay, injury, or loss of a tooth are the most common causes of dental pain. Pain may also occur after an extraction (tooth is pulled out). Pain sometimes originates from other areas and radiates to the jaw, thus appearing to be tooth pain.Bacteria growing inside your mouth can contribute to gum disease and dental decay, both of which can cause pain. A toothache occurs from inflammation of the central portion of the tooth called pulp. The pulp contains nerve endings that are very sensitive to pain. Inflammation to the pulp or pulpitis may be caused by dental cavities, trauma, and infection.    HOME CARE:   For toothaches: Over-the-counter pain medications such as acetaminophen  or ibuprofen  may be used. Take these as directed on the package while you arrange for a dental appointment. Avoid very cold or hot foods, because they may make the pain worse. You may get relief from biting on a cotton ball soaked in oil of cloves. You can get oil of cloves at most drug stores.  For jaw pain:  Aspirin may be helpful for problems in the joint of the jaw in adults. If pain happens every time you open your mouth widely, the temporomandibular joint (TMJ) may be the source of the pain. Yawning or taking a large bite of food may worsen the pain. An appointment with your doctor or dentist will help you find the cause.      GET HELP RIGHT AWAY IF:  You have a high fever or chills If you have had a recent head or face injury and develop headache, light headedness, nausea, vomiting, or other symptoms that concern you after an injury to your face or mouth, you could have a more serious injury in addition to your dental injury. A facial rash associated with a toothache: This condition may improve with medication. Contact your doctor for them to decide what is appropriate. Any jaw pain occurring with chest pain: Although jaw pain is most commonly caused by dental disease, it is sometimes referred pain from other areas. People with heart disease, especially people who have had stents placed, people with diabetes, or those who have had heart surgery may have jaw pain as a symptom of heart attack or angina. If your jaw or tooth pain is associated with lightheadedness, sweating, or shortness of breath, you should see a doctor as soon as possible. Trouble swallowing or excessive pain or bleeding from gums: If you have a history of a weakened immune system, diabetes, or steroid use, you may be more susceptible to infections. Infections can often be more severe and extensive or caused by unusual organisms. Dental and gum infections in people with these conditions may require more aggressive treatment. An abscess may need draining or IV antibiotics, for example.  MAKE SURE YOU   Understand these  instructions. Will watch your condition. Will get help right away if you are not doing well or get worse.  Thank you for choosing an e-visit.  Your e-visit answers were reviewed by a board certified advanced clinical practitioner to complete your personal care plan. Depending upon the condition, your plan could have included both over the counter or prescription medications.  Please review your pharmacy choice. Make sure the pharmacy is open so you can pick up prescription now. If there is a problem, you may contact your provider  through [employer] and have the prescription routed to another pharmacy.  Your safety is important to us . If you have drug allergies check your prescription carefully.   For the next 24 hours you can use MyChart to ask questions about today's visit, request a non-urgent call back, or ask for a work or school excuse. You will get an email in the next two days asking about your experience. I hope that your e-visit has been valuable and will speed your recovery.  Approximately 5 minutes was spent documenting and reviewing patient's chart.

## 2023-07-13 MED ORDER — IBUPROFEN 800 MG PO TABS
800.0000 mg | ORAL_TABLET | Freq: Three times a day (TID) | ORAL | 0 refills | Status: DC | PRN
Start: 2023-07-13 — End: 2023-12-01

## 2023-07-13 NOTE — Addendum Note (Signed)
 Addended by: VIVIENNE DELON HERO on: 07/13/2023 09:15 AM   Modules accepted: Orders

## 2023-12-01 ENCOUNTER — Telehealth: Admitting: Physician Assistant

## 2023-12-01 DIAGNOSIS — L732 Hidradenitis suppurativa: Secondary | ICD-10-CM

## 2023-12-01 MED ORDER — DOXYCYCLINE HYCLATE 100 MG PO TABS
100.0000 mg | ORAL_TABLET | Freq: Two times a day (BID) | ORAL | 0 refills | Status: AC
Start: 2023-12-01 — End: 2023-12-15

## 2023-12-01 MED ORDER — IBUPROFEN 800 MG PO TABS
800.0000 mg | ORAL_TABLET | Freq: Three times a day (TID) | ORAL | 0 refills | Status: AC | PRN
Start: 2023-12-01 — End: 2023-12-08

## 2023-12-01 NOTE — Progress Notes (Signed)
 E Visit for Hidradenitis Suppurativa  We are sorry that you are not feeling well. Here is how we plan to help!  Based on what you shared with me it looks like you have a flare of HS.  HS is a chronic, inflammatory condition that is neither contagious nor due to poor hygiene. Although HS has been associated with smoking and obesity, HS can also occur in the absence of these characteristics.  A family history of HS is common among individuals with HS. Topical antiseptic washes, such as chlorhexidine 4%, benzoyl peroxide, or zinc  pyrithione, to cleanse skin in the affected areas might be beneficial   I have prescribed:  Doxycycline  100 mg twice daily for 14 days. For pain, I have prescribed Ibuprofen  800 mg, three times a day as needed. Please do not take if known history if kidney disorder, GI bleeds. Take with food to avoid stomach upset.   I also recommend a follow-up with dermatology for management of frequent flares and to discuss other treatment options.      HOME CARE:  Take your medications as ordered and take all of them, even if the skin irritation appears to be healing.   GET HELP RIGHT AWAY IF:  Symptoms that don't begin to go away within 48 hours. Severe redness persists or worsens If the area turns color, spreads or swells. If it blisters and opens, develops yellow-brown crust or bleeds. You develop a fever or chills. If the pain increases or becomes unbearable.  Are unable to keep fluids and food down.  MAKE SURE YOU   Understand these instructions. Will watch your condition. Will get help right away if you are not doing well or get worse.  Thank you for choosing an e-visit. Your e-visit answers were reviewed by a board certified advanced clinical practitioner to complete your personal care plan. Depending upon the condition, your plan could have included both over the counter or prescription medications. Please review your pharmacy choice. Make sure the pharmacy is  open so you can pick up prescription now. If there is a problem, you may contact your provider through [employer] and have the prescription routed to another pharmacy. Your safety is important to us . If you have drug allergies check your prescription carefully.  For the next 24 hours you can use MyChart to ask questions about today's visit, request a non-urgent call back, or ask for a work or school excuse. You will get an email in the next two days asking about your experience. I hope that your e-visit has been valuable and will speed your recovery.   I have spent 5 minutes in review of e-visit questionnaire, review and updating patient chart, medical decision making and response to patient.   Tymere Depuy, PA-C

## 2024-01-05 ENCOUNTER — Telehealth: Admitting: Emergency Medicine

## 2024-01-05 DIAGNOSIS — B9689 Other specified bacterial agents as the cause of diseases classified elsewhere: Secondary | ICD-10-CM | POA: Diagnosis not present

## 2024-01-05 DIAGNOSIS — J019 Acute sinusitis, unspecified: Secondary | ICD-10-CM

## 2024-01-05 MED ORDER — AMOXICILLIN-POT CLAVULANATE 875-125 MG PO TABS: 1.0000 | ORAL_TABLET | Freq: Two times a day (BID) | ORAL | 0 refills | Status: AC

## 2024-01-05 NOTE — Progress Notes (Signed)
 E-Visit for Sinus Problems  We are sorry that you are not feeling well.  Here is how we plan to help!  Based on what you have shared with me it looks like you have sinusitis.  Sinusitis is inflammation and infection in the sinus cavities of the head.  Based on your presentation I believe you most likely have Acute Bacterial Sinusitis.  This is an infection caused by bacteria and is treated with antibiotics. I have prescribed Augmentin  875mg /125mg  one tablet twice daily with food, for 7 days. You may use an oral decongestant such as Mucinex D or if you have glaucoma or high blood pressure use plain Mucinex. Saline nasal spray help and can safely be used as often as needed for congestion.  If you develop worsening sinus pain, fever or notice severe headache and vision changes, or if symptoms are not better after completion of antibiotic, please schedule an appointment with a health care provider.    Sinus infections are not as easily transmitted as other respiratory infection, however we still recommend that you avoid close contact with loved ones, especially the very young and elderly.  Remember to wash your hands thoroughly throughout the day as this is the number one way to prevent the spread of infection!  Home Care: Only take medications as instructed by your medical team. Complete the entire course of an antibiotic. Do not take these medications with alcohol. A steam or ultrasonic humidifier can help congestion.  You can place a towel over your head and breathe in the steam from hot water coming from a faucet. Avoid close contacts especially the very young and the elderly. Cover your mouth when you cough or sneeze. Always remember to wash your hands.  Get Help Right Away If: You develop worsening fever or sinus pain. You develop a severe head ache or visual changes. Your symptoms persist after you have completed your treatment plan.  Make sure you Understand these instructions. Will  watch your condition. Will get help right away if you are not doing well or get worse.  Your e-visit answers were reviewed by a board certified advanced clinical practitioner to complete your personal care plan.  Depending on the condition, your plan could have included both over the counter or prescription medications.  If there is a problem please reply  once you have received a response from your provider.  Your safety is important to us .  If you have drug allergies check your prescription carefully.    You can use MyChart to ask questions about today's visit, request a non-urgent call back, or ask for a work or school excuse for 24 hours related to this e-Visit. If it has been greater than 24 hours you will need to follow up with your provider, or enter a new e-Visit to address those concerns.  You will get an e-mail in the next two days asking about your experience.  I hope that your e-visit has been valuable and will speed your recovery. Thank you for using e-visits.  I have spent 5 minutes in review of e-visit questionnaire, review and updating patient chart, medical decision making and response to patient.   Lamar Schlossman, PA-C

## 2024-01-20 ENCOUNTER — Telehealth: Admitting: Physician Assistant

## 2024-01-20 DIAGNOSIS — J3089 Other allergic rhinitis: Secondary | ICD-10-CM

## 2024-01-20 MED ORDER — IPRATROPIUM BROMIDE 0.03 % NA SOLN: 2.0000 | Freq: Two times a day (BID) | NASAL | 0 refills | Status: AC

## 2024-01-20 NOTE — Progress Notes (Signed)
 E visit for Allergic Rhinitis We are sorry that you are not feeling well.  Here is how we plan to help!  Based on what you have shared with me, it looks like you have Allergic Rhinitis.  Rhinitis is when a reaction occurs that causes nasal congestion, runny nose, sneezing, and itching.    There are several types of rhinitis, with the most common being acute rhinitis, allergic rhinitis, and nonallergic rhinitis. Acute rhinitis is typically caused by a viral infection. Allergic rhinitis, also known as seasonal rhinitis, occurs at certain times of the year when airborne allergens--such as pollen--trigger the immune system to release histamine. This chemical causes symptoms like itching, swelling, and fluid buildup in the delicate linings of the nasal passages, sinuses, and eyelids. As a result, individuals often experience an itchy nose and a clear nasal discharge. Nonallergic rhinitis, on the other hand, is not triggered by allergens and tends to persist year-round.   You should continue to take a daily antihistamine, such as Claritin, or Zyrtec.  If you have been on the Claritin for a long time sometimes it is beneficial to change to a different antihistamine such as Zyrtec, Allegra or Xyzal.   A nasal spray would also be beneficial. I have prescribed Atrovent , for your nasal congestion   HOME CARE:  You can use an over-the-counter saline nasal spray as needed. Avoid areas where there is heavy dust, mites, or molds. Stay indoors on windy days during the pollen season. Keep windows closed in home, at least in bedroom; use air conditioner. Keep house pets out of the bedroom or at least off of your bed. Use high-efficiency house air filters in the home. Keep windows closed in car, turn AC on re-circulate. Avoid playing outside with animals during peak pollen season.  GET HELP RIGHT AWAY IF:  If your symptoms do not improve within 10 days. You become short of breath. You develop yellow or green  discharge from your nose for over 3 days. You have coughing fits.  MAKE SURE YOU:  Understand these instructions. Monitor your symptoms and get help right away if anything continues to worsen despite treatment.  Thank you for choosing an e-visit.   Your e-visit answers were reviewed by a board certified advanced clinical practitioner to complete your personal care plan. Depending upon the condition, your plan could have included both over the counter or prescription medications.   Please review your pharmacy choice. Be sure that the pharmacy you have chosen is open so that you can pick up your prescription now.  If there is a problem, you may message your provider in MyChart to have the prescription routed to another pharmacy.   Your safety is important to us . If you have drug allergies, check your prescription carefully.    For the next 24 hours, you can use MyChart to ask questions about todays visit, request a non-urgent call back, or ask for a work or school excuse from your e-visit provider.   You will receive an email in the next two days asking about your experience. I hope that your e-visit has been valuable and will speed up your recovery.    I have spent 5 minutes in review of e-visit questionnaire, review and updating patient chart, medical decision making and response to patient.   Kitrina Maurin, PA-C
